# Patient Record
Sex: Male | Born: 1950
Health system: Southern US, Community
[De-identification: ages and names within clinical notes are randomized; demographics above are authoritative.]

## PROBLEM LIST (undated history)

## (undated) DIAGNOSIS — I10 Essential (primary) hypertension: Secondary | ICD-10-CM

## (undated) DIAGNOSIS — E119 Type 2 diabetes mellitus without complications: Secondary | ICD-10-CM

## (undated) DIAGNOSIS — E785 Hyperlipidemia, unspecified: Secondary | ICD-10-CM

## (undated) DIAGNOSIS — I25729 Atherosclerosis of autologous artery coronary artery bypass graft(s) with unspecified angina pectoris: Secondary | ICD-10-CM

## (undated) HISTORY — DX: Atherosclerosis of autologous artery coronary artery bypass graft(s) with unspecified angina pectoris: I25.729

## (undated) HISTORY — DX: Type 2 diabetes mellitus without complications: E11.9

## (undated) HISTORY — PX: CORONARY ARTERY BYPASS GRAFT: SHX141

---

## 2010-01-19 ENCOUNTER — Emergency Department (HOSPITAL_COMMUNITY): Admission: EM | Admit: 2010-01-19 | Discharge: 2010-01-19 | Payer: Self-pay | Admitting: Emergency Medicine

## 2010-01-19 ENCOUNTER — Emergency Department (HOSPITAL_COMMUNITY): Admission: EM | Admit: 2010-01-19 | Discharge: 2010-01-19 | Payer: Self-pay | Admitting: Family Medicine

## 2010-08-23 ENCOUNTER — Emergency Department (HOSPITAL_COMMUNITY): Payer: Self-pay

## 2010-08-23 ENCOUNTER — Inpatient Hospital Stay (HOSPITAL_COMMUNITY)
Admission: EM | Admit: 2010-08-23 | Discharge: 2010-08-24 | DRG: 866 | Disposition: A | Discharge status: Home / Self Care | Payer: Self-pay | Attending: Internal Medicine | Admitting: Internal Medicine

## 2010-08-23 DIAGNOSIS — F17200 Nicotine dependence, unspecified, uncomplicated: Secondary | ICD-10-CM | POA: Diagnosis present

## 2010-08-23 DIAGNOSIS — B9789 Other viral agents as the cause of diseases classified elsewhere: Principal | ICD-10-CM | POA: Diagnosis present

## 2010-08-23 LAB — CSF CELL COUNT WITH DIFFERENTIAL
Tube #: 1
Tube #: 4

## 2010-08-23 LAB — URINALYSIS, ROUTINE W REFLEX MICROSCOPIC
Ketones, ur: 15 mg/dL — AB
Leukocytes, UA: NEGATIVE
Nitrite: NEGATIVE
Protein, ur: 100 mg/dL — AB
pH: 5.5 (ref 5.0–8.0)

## 2010-08-23 LAB — CBC
HCT: 45.1 % (ref 39.0–52.0)
Hemoglobin: 16.2 g/dL (ref 13.0–17.0)
MCH: 31.8 pg (ref 26.0–34.0)
MCHC: 35.9 g/dL (ref 30.0–36.0)
MCV: 88.4 fL (ref 78.0–100.0)
RDW: 13.4 % (ref 11.5–15.5)

## 2010-08-23 LAB — GRAM STAIN

## 2010-08-23 LAB — COMPREHENSIVE METABOLIC PANEL
ALT: 40 U/L (ref 0–53)
BUN: 15 mg/dL (ref 6–23)
CO2: 24 mEq/L (ref 19–32)
Calcium: 8.8 mg/dL (ref 8.4–10.5)
GFR calc non Af Amer: >60 mL/min (ref >60)
Glucose, Bld: 126 mg/dL — ABNORMAL HIGH (ref 70–99)
Sodium: 135 mEq/L (ref 135–145)
Total Protein: 7.8 g/dL (ref 6.0–8.3)

## 2010-08-23 LAB — DIFFERENTIAL
Basophils Absolute: 0 10*3/uL (ref 0.0–0.1)
Eosinophils Relative: 0 % (ref 0–5)
Lymphocytes Relative: 8 % — ABNORMAL LOW (ref 12–46)
Lymphs Abs: 1.1 10*3/uL (ref 0.7–4.0)
Monocytes Absolute: 1.4 10*3/uL — ABNORMAL HIGH (ref 0.1–1.0)
Monocytes Relative: 10 % (ref 3–12)
Neutro Abs: 11.8 10*3/uL — ABNORMAL HIGH (ref 1.7–7.7)

## 2010-08-23 LAB — GLUCOSE, CSF: Glucose, CSF: 79 mg/dL — ABNORMAL HIGH (ref 43–76)

## 2010-08-23 LAB — URINE MICROSCOPIC-ADD ON

## 2010-08-23 LAB — PROTEIN, CSF: Total  Protein, CSF: 25 mg/dL (ref 15–45)

## 2010-08-24 ENCOUNTER — Observation Stay (HOSPITAL_COMMUNITY): Payer: Self-pay

## 2010-08-24 LAB — COMPREHENSIVE METABOLIC PANEL
AST: 22 U/L (ref 0–37)
Albumin: 3 g/dL — ABNORMAL LOW (ref 3.5–5.2)
Calcium: 8.4 mg/dL (ref 8.4–10.5)
Creatinine, Ser: 0.77 mg/dL (ref 0.4–1.5)
GFR calc Af Amer: >60 mL/min (ref >60)
GFR calc non Af Amer: >60 mL/min (ref >60)

## 2010-08-24 LAB — URINE CULTURE
Colony Count: NO GROWTH
Culture  Setup Time: 201205171655

## 2010-08-24 LAB — CARDIAC PANEL(CRET KIN+CKTOT+MB+TROPI)
CK, MB: 1.6 ng/mL (ref 0.3–4.0)
Relative Index: INVALID (ref 0.0–2.5)
Troponin I: <0.3 ng/mL (ref <0.30)

## 2010-08-24 LAB — LIPASE, BLOOD: Lipase: 12 U/L (ref 11–59)

## 2010-08-24 LAB — ANA: Anti Nuclear Antibody(ANA): NEGATIVE

## 2010-08-24 LAB — LIPID PANEL
Total CHOL/HDL Ratio: 6 RATIO
VLDL: 25 mg/dL (ref 0–40)

## 2010-08-24 LAB — LACTIC ACID, PLASMA: Lactic Acid, Venous: 1.1 mmol/L (ref 0.5–2.2)

## 2010-08-24 LAB — RAPID URINE DRUG SCREEN, HOSP PERFORMED
Barbiturates: NOT DETECTED
Cocaine: NOT DETECTED
Opiates: NOT DETECTED
Tetrahydrocannabinol: NOT DETECTED

## 2010-08-24 LAB — CBC
Hemoglobin: 14.3 g/dL (ref 13.0–17.0)
RBC: 4.58 MIL/uL (ref 4.22–5.81)

## 2010-08-24 LAB — CK TOTAL AND CKMB (NOT AT ARMC)
Relative Index: INVALID (ref 0.0–2.5)
Total CK: 76 U/L (ref 7–232)

## 2010-08-24 LAB — SAVE SMEAR

## 2010-08-24 MED ORDER — IOHEXOL 300 MG/ML  SOLN: 100.0000 mL | Freq: Once | INTRAMUSCULAR | Status: DC | PRN

## 2010-08-27 LAB — CSF CULTURE W GRAM STAIN: Culture: NO GROWTH

## 2010-08-29 LAB — CULTURE, BLOOD (ROUTINE X 2): Culture  Setup Time: 201205172324

## 2010-08-30 NOTE — H&P (Signed)
NAMECELIA, GIBBONS                      ACCOUNT NO.:  [id number]  MEDICAL RECORD NO.:  [id number]           PATIENT TYPE:  E  LOCATION:  [facility]                         FACILITY:  [facility]  PHYSICIAN:  Eduard Clos, MDDATE OF BIRTH:  Jun 25, 1950  DATE OF ADMISSION:  08/23/2010 DATE OF DISCHARGE:                             HISTORY & PHYSICAL   PATIENT'S PRIMARY CARE PHYSICIAN:  Unassigned.  The patient does not have a primary care physician.  CHIEF COMPLAINT:  Fever and generalized body ache.  HISTORY OF PRESENTING ILLNESS:  A 60 year old male with no significant past medical history, has been experiencing some fever, chills over the last 3 days which worsened today and he came to the ER.  The patient in addition is having generalized body ache and weakness.  The patient does not have any chest pain or any cough or phlegm.  Denies any shortness of breath.  Denies any headache or any visual symptoms.  Denies any focal deficit, though he feels weak.  The only complain he has is the left thigh pain which was present from today.  The patient denies any nausea, vomiting, abdominal pain, dysuria, discharge or diarrhea.  In the ER, the patient had a CT head, chest x-ray, urinalysis all at this time were negative for anything acute, so the patient underwent a lumbar puncture at this time.  The lumbar puncture opening pressure was 10 as I discussed with radiologist and the WBC count was just one with RBC of 310.  Gram stain is negative.  At this time, the patient has been admitted for further workup for fever of unknown origin.  PAST MEDICAL HISTORY:  Nothing significant.  PAST SURGICAL HISTORY:  None.  SOCIAL HISTORY:  The patient smokes cigarettes, does not drink alcohol or use any drugs.  Married, lives with his wife, has been in [place] for the last 7 years.  He is from [place].  FAMILY HISTORY:  Mom had lung cancer.  ALLERGIES:  No known drug allergies.  MEDICATIONS PRIOR TO  ADMISSION:  None.  REVIEW OF SYSTEMS:  As per history of presenting illness, in addition, the patient denies having traveled anywhere recently, has not gone to any forest or any cruise.  She has not observed any insect bites, has not had any trauma.  Denies any weight loss.  PHYSICAL EXAMINATION:  GENERAL:  The patient examined at bedside, not in acute distress. VITAL SIGNS:  Blood pressure is 117/68, pulse is 80 per minute, temperature 103.8 rectally, respirations 18 and O2 sat 97%.  HEENT: Anicteric.  No pallor.  No facial asymmetry.  Tongue is midline.  I do not see any pharyngeal exudates or any erythema in the pharynx.  PLA positive. NECK:  No neck rigidity and no tenderness in the neck and no discharge from ears, eyes, nose and mouth. CHEST:  Bilateral air entry present.  No rhonchi and no crepitation. HEART:  S1 and S2 heard. ABDOMEN:  Soft and  nontender.  Bowel sounds heard. CNS:  The patient is alert, awake, oriented to time, place and person and moves upper  and lower extremities 5/5. EXTREMITIES:  Peripheral pulses felt.  There is chronic skin changes in his lower extremity.  I do not see any rash.  There is no any acute cyanotic changes, clubbing.  Pulses felt.  LABORATORY DATA:  CBC; WBC is 14.3, hemoglobin is 16.2, hematocrit is 45.1 and platelets 194.  Neutrophils 83%, monocytes 10%, eosinophils 0. Complete metabolic panel; sodium 135, potassium 3.7, chloride 101, carbon dioxide 24, glucose 126, BUN 15, creatinine 0.97, total bilirubin is 0.6, AST 25, ALT 40, total protein is 7.8, albumin 3.7, calcium 8.8. UA shows negative for nitrites, leukocytes, WBC is 0, bacteria rare. Mucus present, glucose 100, ketones 15.  The patient's anion gap is 10. Lumbar puncture tube #1 shows colorless fluid, WBC 1, RBC 310, CSF glucose is 79.  CSF protein is 25.  Gram stain.  CSF no organisms seen. Lumbar puncture pressure was 10 as discussed with radiologist.  Rest of the tests are  pending.  ASSESSMENT:  Fever of unknown origin.  PLAN: 1. At this time, we will admit the patient to telemetry. 2. We do not know exact source for his fever.  He is also complaining     of generalized body ache with particularly left thigh pain for     which I am going to get a left thigh CT scan.  I also got a Doppler     of the lower extremity.  At this time, the patient was started on     some antibiotics for suspected meningitis.  At this time, CSF fluid     is showing a WBC of one with opening pressure just 10 is unlikely     to be bacterial meningitis.  Gram stain is also negative.  We are     going to get blood cultures and urine cultures.  We will repeat     chest x-ray in a.m. We will get peripheral smear and sed rate, ANA     C-reactive protein.  Repeat labs in a.m.  We will also check     lipase.  At this time, the patient has no headache or any meningeal     signs.  At this time, I am going to hold off any antibiotics.  We     will get an EKG and also get a cardiac enzymes.  If EKG shows any     features of pericarditis, then we will get a 2-D echo.  During the     cardiac enzymes in particularly, we will look for any     rhabdomyolysis.  At this time, we are not going to start any     antibiotics.  We will closely observe the patient.  Further     recommendation as condition evolves and clinic course.     Eduard Clos, MD     ANK/MEDQ  D:  08/23/2010  T:  08/24/2010  Job:  [id number]  Electronically Signed by Midge Minium MD on 08/30/2010 06:18:42 AM

## 2010-08-30 NOTE — H&P (Signed)
  [name], Peter Wong                      ACCOUNT NO.:  [id number]  MEDICAL RECORD NO.:  [id number]           PATIENT TYPE:  E  LOCATION:  [facility]                         FACILITY:  [facility]  PHYSICIAN:  Eduard Clos, MDDATE OF BIRTH:  1951/03/15  DATE OF ADMISSION:  08/23/2010 DATE OF DISCHARGE:                             HISTORY & PHYSICAL   ADDENDUM  The patient did have a CT head without contrast which showed no acute or focal intracranial findings.  This showed chronic sinusitis.  The patient's chest x-ray showed low lung volumes with vascular crowding and atelectasis, vascular congestion without overt pulmonary edema.  I am also at this time going to get procalcitonin level and lactic acid level.  We will have x-ray of the paranasal sinuses, and at this time the patient does not complain of any running nose or any tenderness around the sinuses or any sore throat or any difficulty swallowing.     Eduard Clos, MD  ANK/MEDQ  D:  08/24/2010  T:  08/24/2010  Job:  [id number]  Electronically Signed by Midge Minium MD on 08/30/2010 06:18:34 AM

## 2010-09-01 NOTE — Discharge Summary (Signed)
  NAMEZARIAN, COLPITTS                      ACCOUNT NO.:  [id number]  MEDICAL RECORD NO.:  [id number]           PATIENT TYPE:  O  LOCATION:  5509                         FACILITY:  [facility]  PHYSICIAN:  Lonia Blood, M.D.       DATE OF BIRTH:  1950-04-18  DATE OF ADMISSION:  08/23/2010 DATE OF DISCHARGE:  08/24/2010                              DISCHARGE SUMMARY   PRIMARY CARE PHYSICIAN:  This patient has been referred to [facility].  DISCHARGE DIAGNOSES: 1. Febrile.  No etiology, self-limiting, probably viral in nature. 2. Mild leukocytosis of about 14,000.  CONDITION ON DISCHARGE:  The patient was discharged in stable condition. Temperature 97.8, pulse 78, respirations 19, blood pressure 112/68, saturation 94% on room air.  The patient is alert, oriented, demanding to leave saying that he feels perfectly fine and should be let him go home.  PROCEDURE DURING THIS ADMISSION: 1. The patient underwent multiple testing including lumbar puncture     which essentially showed a glucose 79, protein of 25, cell count     with 3 white blood cells, 1 white blood cell, 3-10 red blood cells     suggestive of traumatic puncture. 2. MRI of the brain which was essentially within normal limits with     atrophy. 3. CT scan of the femur with IV contrast which was essentially normal     without any signs of infection. 4. Chest x-ray PA and lateral which showed low lung volumes and no     infiltrates. 5. Blood cultures x2 and urine culture which were negative for growth.  HISTORY AND PHYSICAL:  Refer to dictation done by Dr. Toniann Fail.  HOSPITAL COURSE:  Mr. Nephew presented to the emergency room complaining of fever, feeling sick.  He underwent testing for everything possible including bacteremia, urinary tract infection, cellulitis, myositis, meningitis without any clear infectious cause being found.  Furthermore by hospital day #2, the fever resolved and the patient requested to be sent  home.  We proceeded with discharging him home instructing him to call for followup with [facility].     Lonia Blood, M.D.     SL/MEDQ  D:  08/24/2010  T:  08/25/2010  Job:  [id number]  Electronically Signed by Lonia Blood M.D. on 09/01/2010 08:18:04 AM

## 2013-09-21 ENCOUNTER — Encounter: Payer: Self-pay | Admitting: Podiatry

## 2013-09-21 ENCOUNTER — Ambulatory Visit (INDEPENDENT_AMBULATORY_CARE_PROVIDER_SITE_OTHER): Payer: BC Managed Care – PPO | Admitting: Podiatry

## 2013-09-21 VITALS — BP 137/80 | HR 78 | Ht 66.93 in | Wt 183.0 lb

## 2013-09-21 DIAGNOSIS — M79606 Pain in leg, unspecified: Secondary | ICD-10-CM | POA: Insufficient documentation

## 2013-09-21 DIAGNOSIS — M216X9 Other acquired deformities of unspecified foot: Secondary | ICD-10-CM | POA: Insufficient documentation

## 2013-09-21 DIAGNOSIS — M79609 Pain in unspecified limb: Secondary | ICD-10-CM

## 2013-09-21 DIAGNOSIS — M722 Plantar fascial fibromatosis: Secondary | ICD-10-CM

## 2013-09-21 NOTE — Progress Notes (Signed)
Subjective: 63 year old male presents complaining of Left heel pain x 2 months at plantar medial aspect. On feet all day in retail store. This is first episode. Feet sweats a lot and must keep slippers on.  Objective: Dermatologic: Dry peeling skin plantar bilateral. Neurologic: All epicritic and tactile sensations grossly intact.  Vascular: All pedal pulses are palpable.  Orthopedic: Tight Achilles tendon left, mild hypermobility of left first ray. Pain upon pressure at plantar medial heel.   Assessment: Plantar fasciitis left. Ankle Equinus left. Forefoot varus left.   Plan: Reviewed clinical findings and available options. Left heel injected with mixture of 4 mg Dexamethasone, 4 mg Triamcinolone, and 1 cc of 0.5% Marcaine plain. Patient tolerated well without difficulty.  Return if pain continues.

## 2013-09-21 NOTE — Patient Instructions (Signed)
Seen for painful heel left foot.  Cortisone injection given.  Need to replace worn out shoes.  Return if pain continues.

## 2013-12-01 ENCOUNTER — Encounter: Payer: Self-pay | Admitting: Podiatry

## 2013-12-01 ENCOUNTER — Ambulatory Visit: Payer: BC Managed Care – PPO | Admitting: Podiatry

## 2013-12-01 ENCOUNTER — Ambulatory Visit (INDEPENDENT_AMBULATORY_CARE_PROVIDER_SITE_OTHER): Payer: BC Managed Care – PPO | Admitting: Podiatry

## 2013-12-01 VITALS — BP 139/77 | HR 71 | Ht 66.93 in | Wt 183.0 lb

## 2013-12-01 DIAGNOSIS — M79609 Pain in unspecified limb: Secondary | ICD-10-CM

## 2013-12-01 DIAGNOSIS — M79605 Pain in left leg: Secondary | ICD-10-CM

## 2013-12-01 DIAGNOSIS — M722 Plantar fascial fibromatosis: Secondary | ICD-10-CM

## 2013-12-01 NOTE — Progress Notes (Signed)
Patient presents complaining of pain in left heel and requests for a cortisone injection. He has gotten new pair of tennis shoes but cannot wear them due to excess perspiration on both feet.  Assessment: Plantar fasciitis left.  Plan: Left heel Injected with mixture of 4 mg Dexamethasone, 4 mg Triamcinolone, and 1 cc of 0.5% Marcaine plain. Patient tolerated well without difficulty.

## 2014-10-07 HISTORY — PX: CORONARY ARTERY BYPASS GRAFT: SHX141

## 2014-11-02 DIAGNOSIS — R079 Chest pain, unspecified: Secondary | ICD-10-CM | POA: Insufficient documentation

## 2014-11-03 DIAGNOSIS — I200 Unstable angina: Secondary | ICD-10-CM | POA: Insufficient documentation

## 2014-11-03 DIAGNOSIS — R9439 Abnormal result of other cardiovascular function study: Secondary | ICD-10-CM | POA: Insufficient documentation

## 2014-11-07 DIAGNOSIS — I2510 Atherosclerotic heart disease of native coronary artery without angina pectoris: Secondary | ICD-10-CM | POA: Insufficient documentation

## 2014-11-23 DIAGNOSIS — Z951 Presence of aortocoronary bypass graft: Secondary | ICD-10-CM | POA: Insufficient documentation

## 2014-11-24 DIAGNOSIS — E785 Hyperlipidemia, unspecified: Secondary | ICD-10-CM | POA: Insufficient documentation

## 2014-11-24 DIAGNOSIS — I252 Old myocardial infarction: Secondary | ICD-10-CM | POA: Insufficient documentation

## 2017-05-09 DIAGNOSIS — Z1211 Encounter for screening for malignant neoplasm of colon: Secondary | ICD-10-CM | POA: Insufficient documentation

## 2017-06-04 HISTORY — PX: COLONOSCOPY: SHX174

## 2017-11-04 DIAGNOSIS — G8929 Other chronic pain: Secondary | ICD-10-CM | POA: Insufficient documentation

## 2017-11-04 DIAGNOSIS — M1711 Unilateral primary osteoarthritis, right knee: Secondary | ICD-10-CM | POA: Insufficient documentation

## 2017-11-04 DIAGNOSIS — M25512 Pain in left shoulder: Secondary | ICD-10-CM | POA: Insufficient documentation

## 2018-06-04 ENCOUNTER — Emergency Department (HOSPITAL_BASED_OUTPATIENT_CLINIC_OR_DEPARTMENT_OTHER): Payer: BLUE CROSS/BLUE SHIELD

## 2018-06-04 ENCOUNTER — Emergency Department (HOSPITAL_BASED_OUTPATIENT_CLINIC_OR_DEPARTMENT_OTHER)
Admission: EM | Admit: 2018-06-04 | Discharge: 2018-06-04 | Disposition: A | Discharge status: Home / Self Care | Payer: BLUE CROSS/BLUE SHIELD | Attending: Emergency Medicine | Admitting: Emergency Medicine

## 2018-06-04 ENCOUNTER — Other Ambulatory Visit: Payer: Self-pay

## 2018-06-04 ENCOUNTER — Encounter (HOSPITAL_BASED_OUTPATIENT_CLINIC_OR_DEPARTMENT_OTHER): Payer: Self-pay | Admitting: Emergency Medicine

## 2018-06-04 DIAGNOSIS — I2510 Atherosclerotic heart disease of native coronary artery without angina pectoris: Secondary | ICD-10-CM | POA: Diagnosis not present

## 2018-06-04 DIAGNOSIS — I10 Essential (primary) hypertension: Secondary | ICD-10-CM | POA: Insufficient documentation

## 2018-06-04 DIAGNOSIS — F17210 Nicotine dependence, cigarettes, uncomplicated: Secondary | ICD-10-CM | POA: Diagnosis not present

## 2018-06-04 DIAGNOSIS — Z951 Presence of aortocoronary bypass graft: Secondary | ICD-10-CM | POA: Insufficient documentation

## 2018-06-04 DIAGNOSIS — R220 Localized swelling, mass and lump, head: Secondary | ICD-10-CM

## 2018-06-04 DIAGNOSIS — R002 Palpitations: Secondary | ICD-10-CM

## 2018-06-04 DIAGNOSIS — Z79899 Other long term (current) drug therapy: Secondary | ICD-10-CM | POA: Diagnosis not present

## 2018-06-04 DIAGNOSIS — R600 Localized edema: Secondary | ICD-10-CM | POA: Diagnosis present

## 2018-06-04 HISTORY — DX: Essential (primary) hypertension: I10

## 2018-06-04 HISTORY — DX: Hyperlipidemia, unspecified: E78.5

## 2018-06-04 LAB — CBC WITH DIFFERENTIAL/PLATELET
ABS IMMATURE GRANULOCYTES: 0.07 10*3/uL (ref 0.00–0.07)
Basophils Absolute: 0 10*3/uL (ref 0.0–0.1)
Basophils Relative: 0 %
EOS PCT: 2 %
Eosinophils Absolute: 0.2 10*3/uL (ref 0.0–0.5)
HEMATOCRIT: 42.2 % (ref 39.0–52.0)
Hemoglobin: 14.2 g/dL (ref 13.0–17.0)
IMMATURE GRANULOCYTES: 1 %
LYMPHS ABS: 0.8 10*3/uL (ref 0.7–4.0)
LYMPHS PCT: 6 %
MCH: 30.9 pg (ref 26.0–34.0)
MCHC: 33.6 g/dL (ref 30.0–36.0)
MCV: 91.9 fL (ref 80.0–100.0)
MONO ABS: 0.6 10*3/uL (ref 0.1–1.0)
MONOS PCT: 5 %
NRBC: 0 % (ref 0.0–0.2)
Neutro Abs: 10.7 10*3/uL — ABNORMAL HIGH (ref 1.7–7.7)
Neutrophils Relative %: 86 %
Platelets: 145 10*3/uL — ABNORMAL LOW (ref 150–400)
RBC: 4.59 MIL/uL (ref 4.22–5.81)
RDW: 12.8 % (ref 11.5–15.5)
WBC: 12.4 10*3/uL — ABNORMAL HIGH (ref 4.0–10.5)

## 2018-06-04 LAB — COMPREHENSIVE METABOLIC PANEL
ALT: 32 U/L (ref 0–44)
AST: 25 U/L (ref 15–41)
Albumin: 3.6 g/dL (ref 3.5–5.0)
Alkaline Phosphatase: 72 U/L (ref 38–126)
Anion gap: 10 (ref 5–15)
BILIRUBIN TOTAL: 1.4 mg/dL — AB (ref 0.3–1.2)
BUN: 15 mg/dL (ref 8–23)
CALCIUM: 7.7 mg/dL — AB (ref 8.9–10.3)
CHLORIDE: 99 mmol/L (ref 98–111)
CO2: 20 mmol/L — ABNORMAL LOW (ref 22–32)
CREATININE: 1 mg/dL (ref 0.61–1.24)
Glucose, Bld: 313 mg/dL — ABNORMAL HIGH (ref 70–99)
POTASSIUM: 3.2 mmol/L — AB (ref 3.5–5.1)
Sodium: 129 mmol/L — ABNORMAL LOW (ref 135–145)
Total Protein: 6.3 g/dL — ABNORMAL LOW (ref 6.5–8.1)

## 2018-06-04 LAB — URINALYSIS, MICROSCOPIC (REFLEX)

## 2018-06-04 LAB — PROTIME-INR
INR: 1.2 (ref 0.8–1.2)
Prothrombin Time: 15.2 seconds (ref 11.4–15.2)

## 2018-06-04 LAB — TROPONIN I: Troponin I: <0.03 ng/mL (ref <0.03)

## 2018-06-04 LAB — URINALYSIS, ROUTINE W REFLEX MICROSCOPIC
Bilirubin Urine: NEGATIVE
Glucose, UA: >=500 mg/dL — AB
KETONES UR: NEGATIVE mg/dL
Leukocytes,Ua: NEGATIVE
Nitrite: NEGATIVE
Protein, ur: NEGATIVE mg/dL
Specific Gravity, Urine: <1.005 — ABNORMAL LOW (ref 1.005–1.030)
pH: 5.5 (ref 5.0–8.0)

## 2018-06-04 LAB — LACTIC ACID, PLASMA: Lactic Acid, Venous: 1.9 mmol/L (ref 0.5–1.9)

## 2018-06-04 MED ORDER — CLINDAMYCIN HCL 150 MG PO CAPS: 300.0000 mg | ORAL_CAPSULE | Freq: Three times a day (TID) | ORAL | 0 refills | Status: AC

## 2018-06-04 MED ORDER — SODIUM CHLORIDE 0.9% FLUSH
3.0000 mL | Freq: Once | INTRAVENOUS | Status: DC
  Filled 2018-06-04: qty 3

## 2018-06-04 MED ORDER — AMOXICILLIN-POT CLAVULANATE 875-125 MG PO TABS: 1.0000 | ORAL_TABLET | Freq: Two times a day (BID) | ORAL | 0 refills | Status: DC

## 2018-06-04 MED ORDER — SODIUM CHLORIDE 0.9 % IV BOLUS
1000.0000 mL | Freq: Once | INTRAVENOUS | Status: AC
  Administered 2018-06-04: 1000 mL via INTRAVENOUS

## 2018-06-04 MED ORDER — IOPAMIDOL (ISOVUE-300) INJECTION 61%: 100.0000 mL | Freq: Once | INTRAVENOUS | Status: DC | PRN

## 2018-06-04 MED ORDER — ACETAMINOPHEN 325 MG PO TABS
650.0000 mg | ORAL_TABLET | Freq: Once | ORAL | Status: AC
  Administered 2018-06-04: 650 mg via ORAL
  Filled 2018-06-04: qty 2

## 2018-06-04 MED ORDER — IOHEXOL 300 MG/ML  SOLN
100.0000 mL | Freq: Once | INTRAMUSCULAR | Status: AC | PRN
  Administered 2018-06-04: 75 mL via INTRAVENOUS

## 2018-06-04 MED ORDER — SODIUM CHLORIDE 0.9 % IV SOLN
2.0000 g | Freq: Once | INTRAVENOUS | Status: AC
  Administered 2018-06-04: 2 g via INTRAVENOUS
  Filled 2018-06-04: qty 20

## 2018-06-04 NOTE — ED Notes (Signed)
Pt escorted to d/c window 

## 2018-06-04 NOTE — ED Provider Notes (Signed)
[facility] EMERGENCY DEPARTMENT Provider Note   CSN: [id number] Arrival date & time: 06/04/18  [id number]    History   Chief Complaint Chief Complaint  Patient presents with  . Palpitations  . Facial Swelling    HPI Peter RoeSu Wong is a 68 y.o. male with a PMH CAD, HTN, and s/p CABG in 2016 presenting with palpitations and left facial edema onset yesterday morning. Patient speaks primarily [place]. Translator used during history and physical exam. Wife and friend are contributing historians. Patient denies pain. Patient reports facial edema has worsened since onset. Patient states he has tried advil with partial relief. Patient denies taking Advil today. Patient reports constant palpitations onset 2 days ago. Patient reports fever, but denies nausea, vomiting, or abdominal pain. Patient denies congestion, cough, sore throat, or headache. Patient denies chest pain or shortness of breath. Patient states he has not traveled out of the country recently and denies sick exposures. Patient denies wounds. Patient denies pain with eye movement or visual disturbance. Patient denies a history of diabetes.     HPI  Past Medical History:  Diagnosis Date  . Hyperlipidemia   . Hypertension     Patient Active Problem List   Diagnosis Date Noted  . Plantar fasciitis of left foot 09/21/2013  . Equinus deformity of foot, acquired 09/21/2013  . Pain in lower limb 09/21/2013    Past Surgical History:  Procedure Laterality Date  . CORONARY ARTERY BYPASS GRAFT          Home Medications    Prior to Admission medications   Medication Sig Start Date End Date Taking? Authorizing Provider  METOPROLOL SUCCINATE PO Take by mouth.   Yes [provider]  amoxicillin-clavulanate (AUGMENTIN) 875-125 MG tablet Take 1 tablet by mouth every 12 (twelve) hours. 06/04/18   Carlyle BasquesHernandez, Chi Woodham P, PA-C  clindamycin (CLEOCIN) 150 MG capsule Take 2 capsules (300 mg total) by mouth every 8 (eight) hours for 7  days. 06/04/18 06/11/18  Leretha DykesHernandez, Ravon Mortellaro P, PA-C    Family History History reviewed. No pertinent family history.  Social History Social History   Tobacco Use  . Smoking status: Current Every Day Smoker    Types: Cigarettes  . Smokeless tobacco: Never Used  Substance Use Topics  . Alcohol use: Not on file  . Drug use: Not on file     Allergies   Patient has no known allergies.   Review of Systems Review of Systems  Constitutional: Negative for chills, diaphoresis and fever.  HENT: Positive for facial swelling. Negative for congestion, dental problem, ear discharge, ear pain, rhinorrhea, sinus pressure, sinus pain, sore throat, trouble swallowing and voice change.   Eyes: Negative for photophobia, pain, discharge, redness, itching and visual disturbance.  Respiratory: Negative for cough and shortness of breath.   Cardiovascular: Positive for palpitations. Negative for chest pain and leg swelling.  Gastrointestinal: Negative for abdominal pain, nausea and vomiting.  Endocrine: Negative for cold intolerance and heat intolerance.  Genitourinary: Negative for dysuria.  Musculoskeletal: Negative for back pain, neck pain and neck stiffness.  Skin: Negative for rash.  Neurological: Negative for dizziness, syncope, speech difficulty, weakness, numbness and headaches.  Hematological: Negative for adenopathy.     Physical Exam Updated Vital Signs BP 113/67   Pulse 88   Temp 100.3 F (37.9 C)   Resp (!) 24   Ht 5' 6.93" (1.7 m)   Wt 80.7 kg   SpO2 98%   BMI 27.94 kg/m   Physical Exam Vitals signs  and nursing note reviewed.  Constitutional:      General: He is not in acute distress.    Appearance: He is well-developed. He is not diaphoretic.  HENT:     Head: Normocephalic and atraumatic.      Right Ear: Tympanic membrane, ear canal and external ear normal.     Left Ear: Tympanic membrane, ear canal and external ear normal.     Nose: Congestion present. No rhinorrhea.      Mouth/Throat:     Mouth: Mucous membranes are moist.     Pharynx: No oropharyngeal exudate or posterior oropharyngeal erythema.  Eyes:     General:        Right eye: No discharge.        Left eye: No discharge.     Extraocular Movements: Extraocular movements intact.     Conjunctiva/sclera: Conjunctivae normal.     Pupils: Pupils are equal, round, and reactive to light.  Neck:     Musculoskeletal: Normal range of motion and neck supple. No muscular tenderness.  Cardiovascular:     Rate and Rhythm: Normal rate and regular rhythm.     Heart sounds: Normal heart sounds. No murmur. No friction rub. No gallop.   Pulmonary:     Effort: Pulmonary effort is normal. No respiratory distress.     Breath sounds: Normal breath sounds. No wheezing or rales.  Abdominal:     General: There is no distension.     Palpations: Abdomen is soft.     Tenderness: There is no abdominal tenderness.  Musculoskeletal: Normal range of motion.     Right lower leg: No edema.     Left lower leg: No edema.  Skin:    General: Skin is warm.     Findings: No erythema or rash.  Neurological:     Mental Status: He is alert and oriented to person, place, and time.      ED Treatments / Results  Labs (all labs ordered are listed, but only abnormal results are displayed) Labs Reviewed  COMPREHENSIVE METABOLIC PANEL - Abnormal; Notable for the following components:      Result Value   Sodium 129 (*)    Potassium 3.2 (*)    CO2 20 (*)    Glucose, Bld 313 (*)    Calcium 7.7 (*)    Total Protein 6.3 (*)    Total Bilirubin 1.4 (*)    All other components within normal limits  CBC WITH DIFFERENTIAL/PLATELET - Abnormal; Notable for the following components:   WBC 12.4 (*)    Platelets 145 (*)    Neutro Abs 10.7 (*)    All other components within normal limits  URINALYSIS, ROUTINE W REFLEX MICROSCOPIC - Abnormal; Notable for the following components:   Specific Gravity, Urine <1.005 (*)    Glucose, UA >=500  (*)    Hgb urine dipstick TRACE (*)    All other components within normal limits  URINALYSIS, MICROSCOPIC (REFLEX) - Abnormal; Notable for the following components:   Bacteria, UA MANY (*)    All other components within normal limits  CULTURE, BLOOD (ROUTINE X 2)  CULTURE, BLOOD (ROUTINE X 2)  LACTIC ACID, PLASMA  PROTIME-INR  TROPONIN I    EKG EKG Interpretation  Date/Time:  Thursday June 04 2018 10:06:40 EST Ventricular Rate:  98 PR Interval:    QRS Duration: 91 QT Interval:  333 QTC Calculation: 426 R Axis:   -12 Text Interpretation:  Sinus rhythm Borderline prolonged PR interval Abnormal  R-wave progression, early transition Nonspecific T abnormalities, anterior leads Confirmed by Jacalyn Lefevre [phone]) on 06/04/2018 10:13:36 AM   Radiology Dg Chest 2 View  Result Date: 06/04/2018 CLINICAL DATA:  Two day history of heart palpitations. EXAM: CHEST - 2 VIEW COMPARISON:  11/10/2014 FINDINGS: Previous median sternotomy. Mild cardiomegaly. Chronic appearing lung markings consistent with scarring. Pulmonary vascularity is normal without edema or effusions. No focal mass, consolidation or collapse. No significant bone finding. IMPRESSION: Previous CABG. Mild cardiomegaly. Likely chronic lung markings. No sign of acute heart failure. Electronically Signed   By: Paulina Fusi M.D.   On: 06/04/2018 10:57   Ct Soft Tissue Neck W Contrast  Result Date: 06/04/2018 CLINICAL DATA:  Left-sided face and neck swelling. EXAM: CT NECK WITH CONTRAST TECHNIQUE: Multidetector CT imaging of the neck was performed using the standard protocol following the bolus administration of intravenous contrast. CONTRAST:  34mL OMNIPAQUE IOHEXOL 300 MG/ML  SOLN COMPARISON:  CT and MRI May of 2012. FINDINGS: The study suffers from motion degradation. Pharynx and larynx: No mucosal or submucosal lesion is seen allowing for the motion. Salivary glands: Right parotid and submandibular glands are normal. Question mild  swelling and enhancement of the left parotid gland with mild edema in the soft tissue planes of the left neck which could go along with left parotid sialoadenitis. I do not see evidence of a mass or stone. No sign of an abscess. Left submandibular gland is normal. Thyroid: Normal Lymph nodes: No enlarged or low-density nodes on either side of the neck. Vascular: No arterial or vascular occlusion identified. Minimal atherosclerotic calcification at the carotid bifurcations. Limited intracranial: Normal Visualized orbits: Normal Mastoids and visualized paranasal sinuses: Scattered opacified ethmoid air cells. No advanced sinusitis. Mastoids are clear. Skeleton: Ordinary mid cervical spondylosis. Upper chest: Negative Other: None IMPRESSION: Subtle asymmetry of the left parotid gland compared to the right, suggesting mild parotid sialoadenitis. Some nonspecific soft tissue edema in the left side of the neck. No evidence of an abscess. No evidence of a mass or stone disease. Electronically Signed   By: Paulina Fusi M.D.   On: 06/04/2018 11:29    Procedures Procedures (including critical care time)  Medications Ordered in ED Medications  sodium chloride flush (NS) 0.9 % injection 3 mL (3 mLs Intravenous Not Given 06/04/18 1021)  iopamidol (ISOVUE-300) 61 % injection 100 mL (has no administration in time range)  sodium chloride 0.9 % bolus 1,000 mL (0 mLs Intravenous Stopped 06/04/18 1220)  acetaminophen (TYLENOL) tablet 650 mg (650 mg Oral Given 06/04/18 1035)  cefTRIAXone (ROCEPHIN) 2 g in sodium chloride 0.9 % 100 mL IVPB (0 g Intravenous Stopped 06/04/18 1145)  iohexol (OMNIPAQUE) 300 MG/ML solution 100 mL (75 mLs Intravenous Contrast Given 06/04/18 1100)     Initial Impression / Assessment and Plan / ED Course  I have reviewed the triage vital signs and the nursing notes.  Pertinent labs & imaging results that were available during my care of the patient were reviewed by me and considered in my medical  decision making (see chart for details).  Clinical Course as of Jun 04 1220  Thu Jun 04, 2018  1103 Previous CABG noted on CXR. Mild cardiomegaly. Likely chronic lung markings. No sign of acute heart failure.    DG Chest 2 View [AH]  1138 Subtle asymmetry of the left parotid gland compared to the right, suggesting mild parotid sialoadenitis. Some nonspecific soft tissue edema in the left side of the neck. No evidence of an  abscess. No evidence of a mass or stone disease.    CT Soft Tissue Neck W Contrast [AH]  1142 Leukocytosis noted at 12.4.   WBC(!): 12.4 [AH]    Clinical Course User Index [AH] Leretha Dykes, PA-C      Patient presents with left sided facial edema and palpitations. Suspect facial edema is likely due to preseptal cellulitis. Patient does not have pain. Provided IVF and antibiotics in the ER. Palpitations are likely due to fever. Tachycardia has improved while in the ER. Advised patient to take tylenol and ibuprofen for fever. Patient denies chest pain or shortness of breath. Patient is stable and will be discharged with antibiotics. Discussed strict return precautions with patient and wife. Patient and wife state they understand and agree with plan.   Findings and plan of care discussed with supervising physician Dr. Particia Nearing who personally evaluated and examined this patient.   Final Clinical Impressions(s) / ED Diagnoses   Final diagnoses:  Palpitations  Facial swelling    ED Discharge Orders         Ordered    amoxicillin-clavulanate (AUGMENTIN) 875-125 MG tablet  Every 12 hours     06/04/18 1217    clindamycin (CLEOCIN) 150 MG capsule  Every 8 hours     06/04/18 1217           Leretha Dykes, [place] 06/04/18 1222    Jacalyn Lefevre, MD 06/04/18 1235

## 2018-06-04 NOTE — Discharge Instructions (Signed)
You have been seen today for facial swelling and palpitations. Please read and follow all provided instructions.   1. Medications: Clindamycin and Augmentin (antibiotics), usual home medications 2. Treatment: rest, drink plenty of fluids 3. Follow Up: Please return to ER in 24-48 hours if symptoms worsen. Please follow up with your primary doctor in 2 days for discussion of your diagnoses and further evaluation after today's visit; if you do not have a primary care doctor use the resource guide provided to find one; Please return to the ER for any new or worsening symptoms. Please obtain all of your results from medical records or have your doctors office obtain the results - share them with your doctor - you should be seen at your doctors office. Call today to arrange your follow up.   Take medications as prescribed. Please review all of the medicines and only take them if you do not have an allergy to them. Return to the emergency room for worsening condition or new concerning symptoms. Follow up with your regular doctor. If you don't have a regular doctor use one of the numbers below to establish a primary care doctor.  Please be aware that if you are taking birth control pills, taking other prescriptions, ESPECIALLY ANTIBIOTICS may make the birth control ineffective - if this is the case, either do not engage in sexual activity or use alternative methods of birth control such as condoms until you have finished the medicine and your family doctor says it is OK to restart them. If you are on a blood thinner such as COUMADIN, be aware that any other medicine that you take may cause the coumadin to either work too much, or not enough - you should have your coumadin level rechecked in next 7 days if this is the case.  ?  It is also a possibility that you have an allergic reaction to any of the medicines that you have been prescribed - Everybody reacts differently to medications and while MOST people have no  trouble with most medicines, you may have a reaction such as nausea, vomiting, rash, swelling, shortness of breath. If this is the case, please stop taking the medicine immediately and contact your physician.  ?  You should return to the ER if you develop severe or worsening symptoms.   Emergency Department Resource Guide 1) Find a Doctor and Pay Out of Pocket Although you won't have to find out who is covered by your insurance plan, it is a good idea to ask around and get recommendations. You will then need to call the office and see if the doctor you have chosen will accept you as a new patient and what types of options they offer for patients who are self-pay. Some doctors offer discounts or will set up payment plans for their patients who do not have insurance, but you will need to ask so you aren't surprised when you get to your appointment.  2) Contact Your Local Health Department Not all health departments have doctors that can see patients for sick visits, but many do, so it is worth a call to see if yours does. If you don't know where your local health department is, you can check in your phone book. The CDC also has a tool to help you locate your state's health department, and many state websites also have listings of all of their local health departments.  3) Find a Walk-in Clinic If your illness is not likely to be very severe or  complicated, you may want to try a walk in clinic. These are popping up all over the country in pharmacies, drugstores, and shopping centers. They're usually staffed by nurse practitioners or physician assistants that have been trained to treat common illnesses and complaints. They're usually fairly quick and inexpensive. However, if you have serious medical issues or chronic medical problems, these are probably not your best option.  No Primary Care Doctor: Call Health Connect at  [phone] - they can help you locate a primary care doctor that  accepts your  insurance, provides certain services, etc. Physician Referral [phone]  Emergency Department Resource Guide 1) Find a Doctor and Pay Out of Pocket Although you won't have to find out who is covered by your insurance plan, it is a good idea to ask around and get recommendations. You will then need to call the office and see if the doctor you have chosen will accept you as a new patient and what types of options they offer for patients who are self-pay. Some doctors offer discounts or will set up payment plans for their patients who do not have insurance, but you will need to ask so you aren't surprised when you get to your appointment.  2) Contact Your Local Health Department Not all health departments have doctors that can see patients for sick visits, but many do, so it is worth a call to see if yours does. If you don't know where your local health department is, you can check in your phone book. The CDC also has a tool to help you locate your state's health department, and many state websites also have listings of all of their local health departments.  3) Find a Walk-in Clinic If your illness is not likely to be very severe or complicated, you may want to try a walk in clinic. These are popping up all over the country in pharmacies, drugstores, and shopping centers. They're usually staffed by nurse practitioners or physician assistants that have been trained to treat common illnesses and complaints. They're usually fairly quick and inexpensive. However, if you have serious medical issues or chronic medical problems, these are probably not your best option.  No Primary Care Doctor: Call Health Connect at  [phone] - they can help you locate a primary care doctor that  accepts your insurance, provides certain services, etc. Physician Referral Service- [phone]  Chronic Pain Problems: Organization         Address  Phone   Notes  [facility]  [phone]  Patients need to be referred by their primary care doctor.   Medication Assistance: Organization         Address  Phone   Notes  [facility] Medication [facility] [address] [place], [place] [address] [phone] --Must be a resident of [facility] -- Must have NO insurance coverage whatsoever (no Medicaid/ Medicare, etc.) -- The pt. MUST have a primary care doctor that directs their care regularly and follows them in the community   [facility]  [phone]   [employer]  [phone]    Agencies that provide inexpensive medical care: Organization         Address  Phone   Notes  [facility]  [phone]   [facility]    [phone]   [facility] [address] [place], [place] [address] [phone]   [facility] [address], [place] [phone]   [facility]    [phone]   [facility]    [phone]   [facility]  [address], [place] Phone:  [phone], Fax:  [phone] Hours of Operation:  9 am - 6 pm, M-F.  Also accepts Medicaid/Medicare and self-pay.  [facility]  [address], [place] Phone: [phone], Fax: [phone]) [phone]. Hours of Operation:  8:30 am - 5:30 pm, M-F.  Also accepts Medicaid and self-pay.  [facility] [place] [address], [place] Phone: [phone]) [phone]   [facility] [address], [place], [place] [phone], Ext. [phone] Mondays & Thursdays: 7-9 AM.  First 15 patients are seen on a first come, first serve basis.    Medicaid-accepting [facility] Providers:  Organization         Address  Phone   Notes  [facility] [address], [place] [phone] Also accepts self-pay patients.  [facility] [address], [place]  [phone]  [phone]   [facility] [address], [place] [phone]   [facility] [address] [place] [address], [place] [phone]   Renaye RakersVeita Bland [address], [place]   [phone] Only accepts [place] patients after they have their name applied to their card.   Self-Pay (no insurance) in [place]:  Organization         Address  Phone   Notes  Sickle Cell Patients, [facility] [address], [place] [phone]   [facility] [address], [place] [phone]) [phone]   [facility]  [address], [place] [phone]   [facility]/Dr. Osei-Bonsu  [address] [place] [address], [place] or [phone], [place] [phone] Phone number for both [place] and [place] locations is the same.  [facility] [address], [place] [phone]   [facility] [place] [address] [place] [address], [place] or [address] [phone]   [facility] [address], [place] [phone], phone; [phone], fax Sees patients 1st and 3rd Saturday of every month.  Must not qualify for public or private insurance (i.e. Medicaid, Medicare, [place] Health Choice, Veterans' Benefits)  Household income should be no more than 200% of the poverty level The clinic cannot treat you if you are pregnant or think you are pregnant  Sexually transmitted diseases are not treated at the clinic.

## 2018-06-04 NOTE — ED Triage Notes (Signed)
Interpreter [id number] used.  Patient states he has had heart palpitations which began 2 days ago.  C/o swelling noted to left eye and face since yesterday morning.

## 2018-06-09 LAB — CULTURE, BLOOD (ROUTINE X 2)
Culture: NO GROWTH
Culture: NO GROWTH
Special Requests: ADEQUATE
Special Requests: ADEQUATE

## 2019-11-25 DIAGNOSIS — F17210 Nicotine dependence, cigarettes, uncomplicated: Secondary | ICD-10-CM | POA: Insufficient documentation

## 2020-03-26 IMAGING — CT CT NECK W/ CM
4 of 5 series · 14 of 33 positions shown, 17 images · IV contrast (omnipaque)
Comparison: CT and MRI Saturday August, 2010.

CLINICAL DATA: Left-sided face and neck swelling.

EXAM:
CT NECK WITH CONTRAST
TECHNIQUE: Multidetector CT imaging of the neck was performed using the
standard protocol following the bolus administration of intravenous
contrast.
CONTRAST:  75mL OMNIPAQUE IOHEXOL 300 MG/ML  SOLN

[Series 3: axial neck · axial · 0.44mm/px · 1 of 111 slices shown]
[im 28/111  bone]
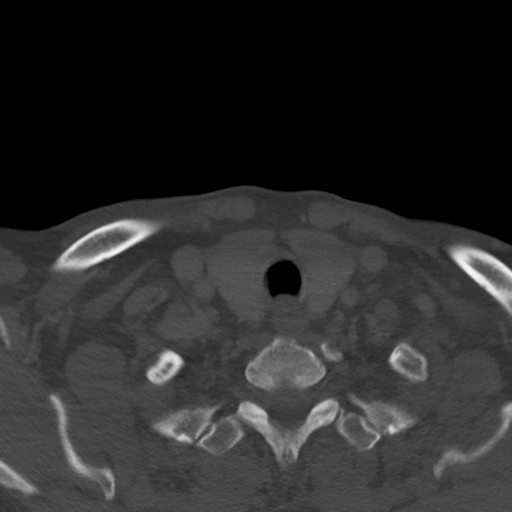

[Series 5: sag neck · sagittal · 0.46mm/px · 5 of 96 slices shown, 6 images]
[im 32/96  bone]
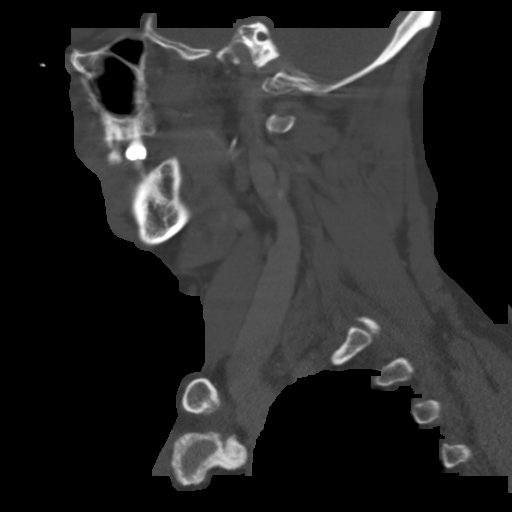
[im 40/96  bone]
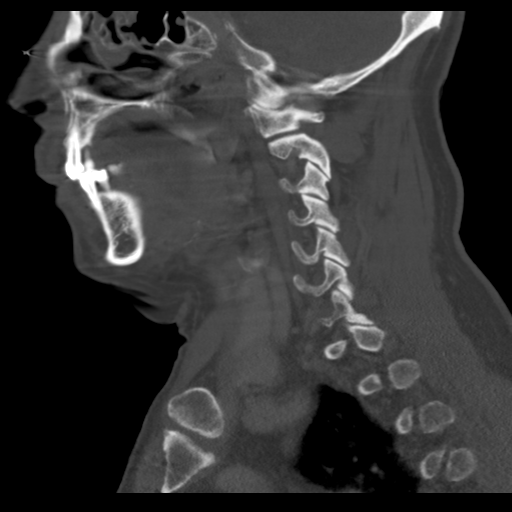
[im 48/96  soft-tissue]
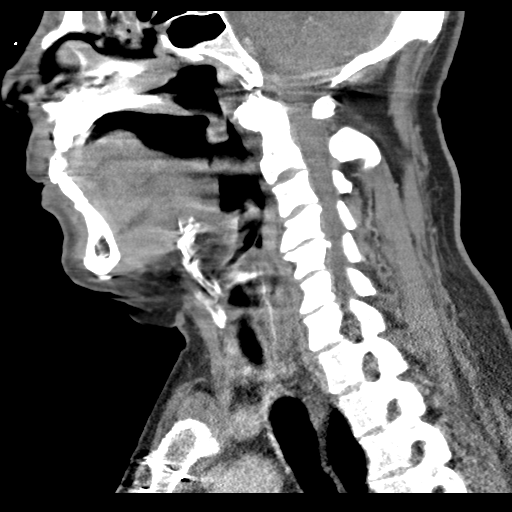
[im 48/96  bone]
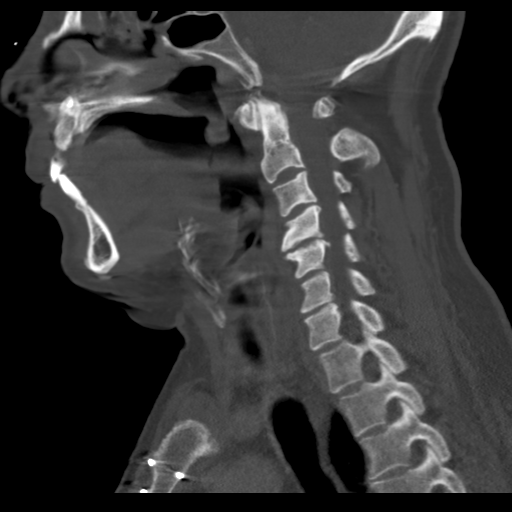
[im 56/96  bone]
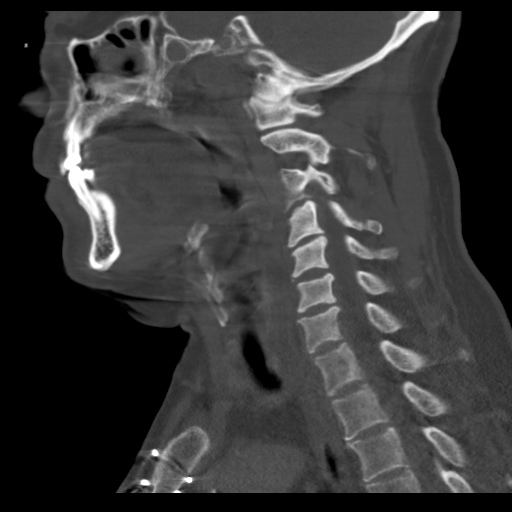
[im 64/96  bone]
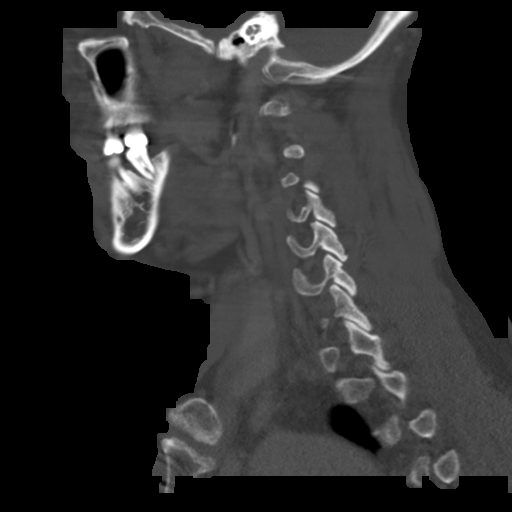

[Series 6: cor neck · coronal · 0.39mm/px · 3 of 115 slices shown]
[im 23/115  bone]
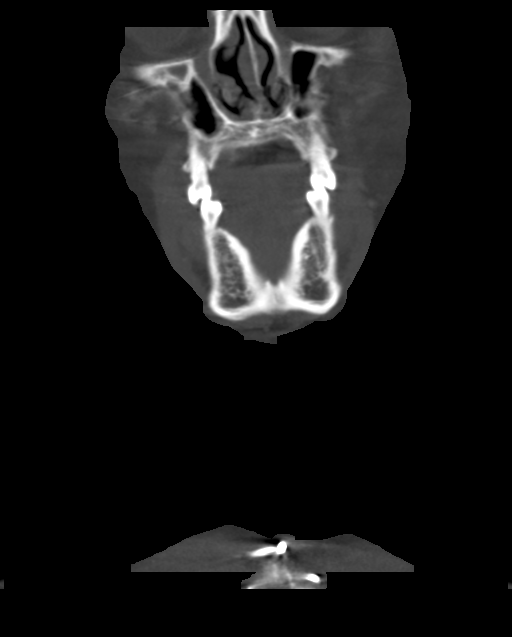
[im 46/115  bone]
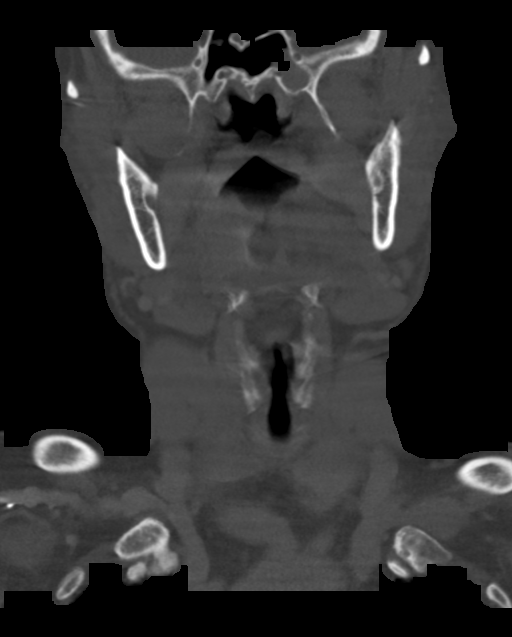
[im 69/115  bone]
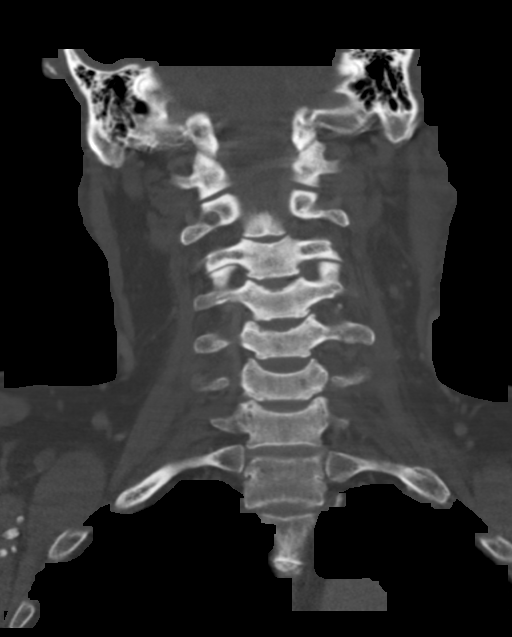

[Series 7: orthogonal ax · axial · 0.40mm/px · z∈[-306,-128]mm · 5 of 137 slices shown, 7 images]
[im 23/137  soft-tissue]
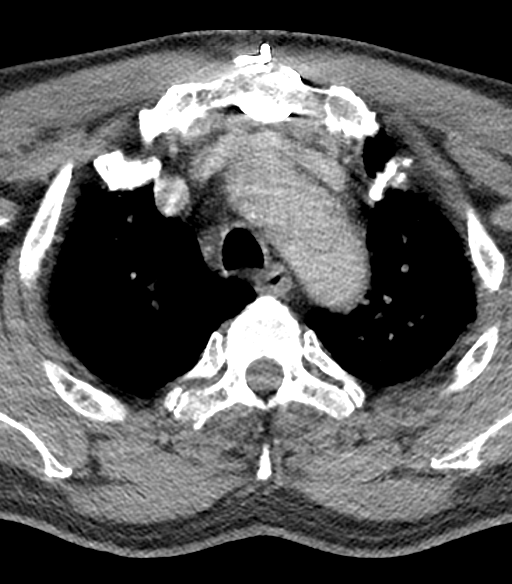
[im 23/137  bone]
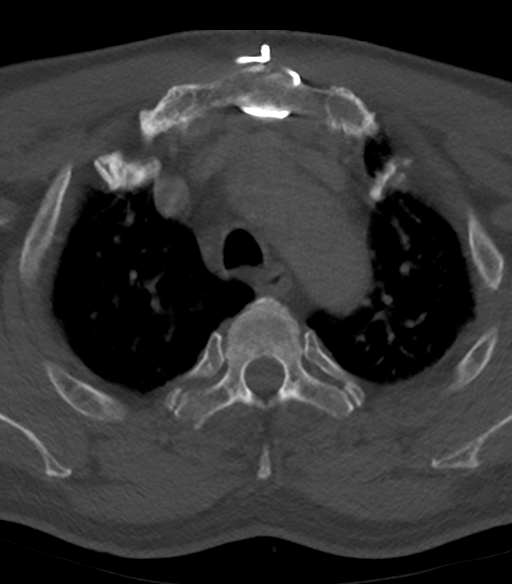
[im 46/137  bone]
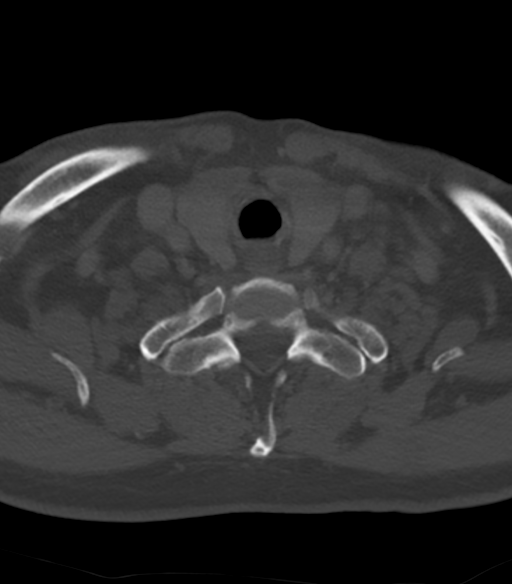
[im 69/137  bone]
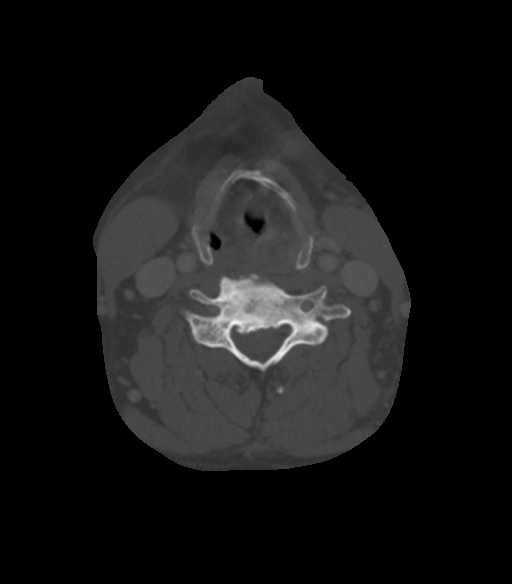
[im 91/137  bone]
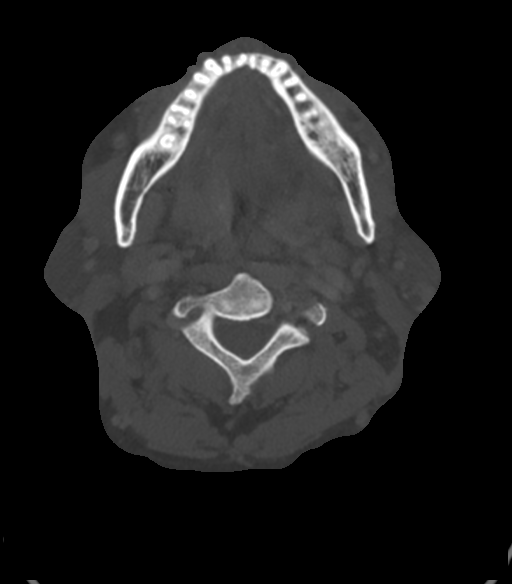
[im 114/137  soft-tissue]
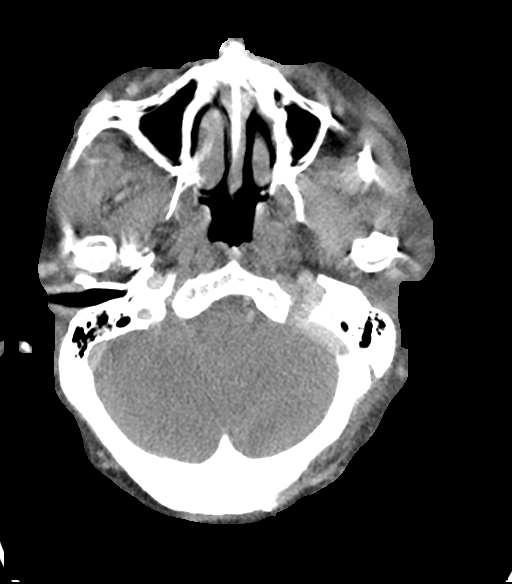
[im 114/137  bone]
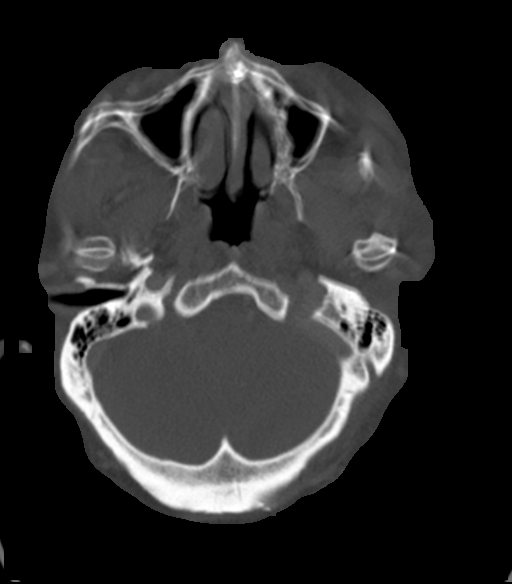

[14 of 33 positions shown; findings below may reference images not displayed]

FINDINGS: The study suffers from motion degradation.

Pharynx and larynx: No mucosal or submucosal lesion is seen allowing
for the motion.

Salivary glands: Right parotid and submandibular glands are normal.
Question mild swelling and enhancement of the left parotid gland
with mild edema in the soft tissue planes of the left neck which
could go along with left parotid sialoadenitis. I do not see
evidence of a mass or stone. No sign of an abscess. Left
submandibular gland is normal.

Thyroid: Normal

Lymph nodes: No enlarged or low-density nodes on either side of the
neck.

Vascular: No arterial or vascular occlusion identified. Minimal
atherosclerotic calcification at the carotid bifurcations.

Limited intracranial: Normal

Visualized orbits: Normal

Mastoids and visualized paranasal sinuses: Scattered opacified
ethmoid air cells. No advanced sinusitis. Mastoids are clear.

Skeleton: Ordinary mid cervical spondylosis.

Upper chest: Negative

Other: None
IMPRESSION: Subtle asymmetry of the left parotid gland compared to the right,
suggesting mild parotid sialoadenitis. Some nonspecific soft tissue
edema in the left side of the neck. No evidence of an abscess. No
evidence of a mass or stone disease.

## 2020-06-26 ENCOUNTER — Other Ambulatory Visit: Payer: Self-pay

## 2020-06-27 ENCOUNTER — Other Ambulatory Visit: Payer: Self-pay

## 2020-06-27 ENCOUNTER — Encounter: Payer: Self-pay | Admitting: Medical

## 2020-06-27 ENCOUNTER — Telehealth: Payer: Self-pay | Admitting: Medical

## 2020-06-27 ENCOUNTER — Other Ambulatory Visit (HOSPITAL_COMMUNITY): Payer: Self-pay | Admitting: Medical

## 2020-06-27 ENCOUNTER — Ambulatory Visit (INDEPENDENT_AMBULATORY_CARE_PROVIDER_SITE_OTHER): Payer: Medicare Other | Admitting: Medical

## 2020-06-27 VITALS — BP 126/55 | HR 60 | Temp 97.6°F | Ht 66.0 in | Wt 170.8 lb

## 2020-06-27 DIAGNOSIS — Z23 Encounter for immunization: Secondary | ICD-10-CM | POA: Diagnosis not present

## 2020-06-27 DIAGNOSIS — K219 Gastro-esophageal reflux disease without esophagitis: Secondary | ICD-10-CM | POA: Diagnosis not present

## 2020-06-27 DIAGNOSIS — H00014 Hordeolum externum left upper eyelid: Secondary | ICD-10-CM

## 2020-06-27 DIAGNOSIS — E785 Hyperlipidemia, unspecified: Secondary | ICD-10-CM | POA: Diagnosis not present

## 2020-06-27 DIAGNOSIS — E1169 Type 2 diabetes mellitus with other specified complication: Secondary | ICD-10-CM | POA: Diagnosis not present

## 2020-06-27 DIAGNOSIS — E1165 Type 2 diabetes mellitus with hyperglycemia: Secondary | ICD-10-CM | POA: Insufficient documentation

## 2020-06-27 DIAGNOSIS — Z1211 Encounter for screening for malignant neoplasm of colon: Secondary | ICD-10-CM

## 2020-06-27 DIAGNOSIS — I2510 Atherosclerotic heart disease of native coronary artery without angina pectoris: Secondary | ICD-10-CM

## 2020-06-27 DIAGNOSIS — Z87891 Personal history of nicotine dependence: Secondary | ICD-10-CM

## 2020-06-27 DIAGNOSIS — E119 Type 2 diabetes mellitus without complications: Secondary | ICD-10-CM | POA: Insufficient documentation

## 2020-06-27 DIAGNOSIS — R739 Hyperglycemia, unspecified: Secondary | ICD-10-CM

## 2020-06-27 LAB — COMPREHENSIVE METABOLIC PANEL
ALT: 32 U/L (ref 0–53)
AST: 22 U/L (ref 0–37)
Albumin: 4.5 g/dL (ref 3.5–5.2)
Alkaline Phosphatase: 103 U/L (ref 39–117)
BUN: 14 mg/dL (ref 6–23)
CO2: 28 mEq/L (ref 19–32)
Calcium: 9.5 mg/dL (ref 8.4–10.5)
Chloride: 105 mEq/L (ref 96–112)
Creatinine, Ser: 0.79 mg/dL (ref 0.40–1.50)
GFR: 91.53 mL/min (ref >60.00)
Glucose, Bld: 102 mg/dL — ABNORMAL HIGH (ref 70–99)
Potassium: 4.5 mEq/L (ref 3.5–5.1)
Sodium: 139 mEq/L (ref 135–145)
Total Bilirubin: 0.7 mg/dL (ref 0.2–1.2)
Total Protein: 7.2 g/dL (ref 6.0–8.3)

## 2020-06-27 LAB — LIPID PANEL
Cholesterol: 149 mg/dL (ref 0–200)
HDL: 39.8 mg/dL (ref >39.00)
LDL Cholesterol: 80 mg/dL (ref 0–99)
NonHDL: 109.16
Total CHOL/HDL Ratio: 4
Triglycerides: 145 mg/dL (ref 0.0–149.0)
VLDL: 29 mg/dL (ref 0.0–40.0)

## 2020-06-27 LAB — HEMOGLOBIN A1C: Hgb A1c MFr Bld: 6.3 % (ref 4.6–6.5)

## 2020-06-27 MED ORDER — METOPROLOL SUCCINATE ER 25 MG PO TB24: 25.0000 mg | ORAL_TABLET | Freq: Every day | ORAL | 1 refills | Status: DC

## 2020-06-27 MED ORDER — TOBRAMYCIN 0.3 % OP SOLN: 2.0000 [drp] | Freq: Four times a day (QID) | OPHTHALMIC | 0 refills | Status: DC

## 2020-06-27 MED ORDER — METFORMIN HCL 500 MG PO TABS: 500.0000 mg | ORAL_TABLET | Freq: Every day | ORAL | 3 refills | Status: DC

## 2020-06-27 MED FILL — TOBRAMYCIN 0.3 % SOLN: 0.3 | 13 days supply | Qty: 5 | Fill #0

## 2020-06-27 MED FILL — METOPROLOL SUCCINATE ER 25: 25 | 30 days supply | Qty: 30 | Fill #0

## 2020-06-27 NOTE — Patient Instructions (Addendum)
History of coronary artery disease and CABG.  Made referral to cardiologist to establish care/evaluate and treat.  History of hyperlipidemia with above history.  Continue atorvastatin 80 mg daily and aspirin.  History of elevated sugar 2 years ago in the 300 range.  This does indicate diabetes.  Will get A1c level today to confirm.  Based on level might refer to endocrinologist.  To be determined treatment.  Potential oral medications and possible injectable medication/insulin.  PCV 13 pneumonia vaccine today.  Did place referral to gastroenterologist to determine if colonoscopy needed.  On review records indicate may have had one in 2019 but I do not see the report in care everywhere.  Also GI will determine if endoscopy needed based on current history as well as patient concern for H. pylori.  To evaluate for H. pylori GI might recommend blood test or breath test.  History of GERD but intermittent/rare.  If becomes daily then recommend over-the-counter omeprazole.  History of smoking placed order for CT chest screening lung cancer.  Can go downstairs to radiology to get scheduled for that.  Left upper lid probable stye.  Prescription of Tobrex given today.  Do warm compresses twice daily.  Update [place] on this Friday or Monday.  If not resolving completely gone refer to ophthalmologist.  Follow-up in 1 month or sooner if needed.

## 2020-06-27 NOTE — Progress Notes (Signed)
Subjective:    Patient ID: Peter Wong, male    DOB: April 10, 1950, 70 y.o.   MRN: [id number]  HPI Pt in for first time.   Pt is fasting today. Pt used to got to [facility].      Hx of CAD and CABG x 4. Pt has not seen cardiology for some time per friendd translating for him.  2 years ago his sugar in February sugar was 313. Pt never been on medication per his report. He was not made  aware of such high level or does not remember?   No report of any hyperglycemic signs or symptoms today.   05-2017 may have had colonoscopy but no report seen on epic review.  Pt has some occasional reflux with spicy foods. His food is spicy. Pt never been treated for h pylori.    Review of Systems  Constitutional: Negative for chills, fatigue and fever.  HENT: Negative for congestion, drooling, ear discharge and ear pain.   Respiratory: Negative for cough, chest tightness, shortness of breath and wheezing.   Cardiovascular: Negative for chest pain and palpitations.  Musculoskeletal: Negative for back pain.  Skin: Negative for rash.  Neurological: Negative for facial asymmetry, speech difficulty, weakness, light-headedness and numbness.  Hematological: Negative for adenopathy. Does not bruise/bleed easily.  Psychiatric/Behavioral: Negative for behavioral problems, confusion and dysphoric mood.    Past Medical History:  Diagnosis Date  . Diabetes mellitus without complication (HCC)   . Hyperlipidemia   . Hypertension      Social History   Socioeconomic History  . Marital status: Married    Spouse name: Not on file  . Number of children: Not on file  . Years of education: Not on file  . Highest education level: Not on file  Occupational History  . Not on file  Tobacco Use  . Smoking status: Current Every Day Smoker    Types: Cigarettes  . Smokeless tobacco: Never Used  Substance and Sexual Activity  . Alcohol use: Not on file  . Drug use: Never  . Sexual activity: Not Currently  Other  Topics Concern  . Not on file  Social History Narrative  . Not on file   Social Determinants of Health   Financial Resource Strain: Not on file  Food Insecurity: Not on file  Transportation Needs: Not on file  Physical Activity: Not on file  Stress: Not on file  Social Connections: Not on file  Intimate Partner Violence: Not on file    Past Surgical History:  Procedure Laterality Date  . CORONARY ARTERY BYPASS GRAFT      No family history on file.  No Known Allergies  Current Outpatient Medications on File Prior to Visit  Medication Sig Dispense Refill  . aspirin 81 MG chewable tablet Chew by mouth.    Marland Kitchen atorvastatin (LIPITOR) 80 MG tablet Take 1 tablet by mouth daily.    Marland Kitchen amoxicillin-clavulanate (AUGMENTIN) 875-125 MG tablet Take 1 tablet by mouth every 12 (twelve) hours. (Patient not taking: Reported on 06/27/2020) 14 tablet 0  . METOPROLOL SUCCINATE PO Take by mouth.     No current facility-administered medications on file prior to visit.    BP (!) 126/55   Pulse 60   Temp 97.6 F (36.4 C)   Ht 5\' 6"  (1.676 m)   Wt 170 lb 12.8 oz (77.5 kg)   SpO2 100%   BMI 27.57 kg/m       Objective:   Physical Exam  General Mental Status-  Alert. General Appearance- Not in acute distress.   Skin General: Color- Normal Color. Moisture- Normal Moisture.  Neck Carotid Arteries- Normal color. Moisture- Normal Moisture. No carotid bruits. No JVD.  Chest and Lung Exam Auscultation: Breath Sounds:-Normal.  Cardiovascular Auscultation:Rythm- Regular. Murmurs & Other Heart Sounds:Auscultation of the heart reveals- No Murmurs.  Abdomen Inspection:-Inspeection Normal. Palpation/Percussion:Note:No mass. Palpation and Percussion of the abdomen reveal- Non Tender, Non Distended + BS, no rebound or guarding.    Neurologic Cranial Nerve exam:- CN III-XII intact(No nystagmus), symmetric smile. Drift Test:- No drift. Romberg Exam:- Negative.  Heal to Toe Gait  exam:-Normal. Finger to Nose:- Normal/Intact Strength:- 5/5 equal and symmetric strength both upper and lower extremities.  Eyes- peerl, left upper eye lid feels like moderate sized stye.    Assessment & Plan:  History of coronary artery disease and CABG.  Made referral to cardiologist to establish care/evaluate and treat.  History of hyperlipidemia with above history.  Continue atorvastatin 80 mg daily and aspirin.  History of elevated sugar 2 years ago in the 300 range.  This does indicate diabetes.  Will get A1c level today to confirm.  Based on level might refer to endocrinologist.  To be determined treatment.  Potential oral medications and possible injectable medication/insulin.  PCV 13 pneumonia vaccine today.  Did place referral to gastroenterologist to determine if colonoscopy needed.  On review records indicate may have had one in 2019 but I do not see the report in care everywhere.  Also GI will determine if endoscopy needed based on current history as well as patient concern for H. pylori.  To evaluate for H. pylori GI might recommend blood test or breath test.  History of GERD but intermittent/rare.  If becomes daily then recommend over-the-counter omeprazole.  History of smoking placed order for CT chest screening lung cancer.  Can go downstairs to radiology to get scheduled for that.  Left upper lid probable stye.  Prescription of Tobrex given today.  Do warm compresses twice daily.  Update [place] on this Friday or Monday.  If not resolving completely gone refer to ophthalmologist.  Follow-up in 1 month or sooner if needed.  Esperanza Richters, PA-C   Time spent with new  patient  today was 45  minutes which consisted of chart review, discussing diagnosis, work up, treatment and documentation.

## 2020-06-27 NOTE — Addendum Note (Signed)
Addended by: Maximino Sarin on: 06/27/2020 10:45 AM   Modules accepted: Orders

## 2020-06-27 NOTE — Telephone Encounter (Signed)
Rx metformin sent to pharmacy. 

## 2020-06-28 LAB — MICROALBUMIN, URINE: Microalb, Ur: 5.9 mg/dL

## 2020-06-28 MED FILL — METFORMIN HCL 500 MG TABS: 500 | 30 days supply | Qty: 30 | Fill #0

## 2020-06-30 ENCOUNTER — Telehealth: Payer: Self-pay | Admitting: Medical

## 2020-06-30 DIAGNOSIS — H0019 Chalazion unspecified eye, unspecified eyelid: Secondary | ICD-10-CM

## 2020-06-30 NOTE — Telephone Encounter (Signed)
Referral to ophthalmologist placed.

## 2020-07-03 DIAGNOSIS — I10 Essential (primary) hypertension: Secondary | ICD-10-CM | POA: Insufficient documentation

## 2020-07-03 DIAGNOSIS — E785 Hyperlipidemia, unspecified: Secondary | ICD-10-CM | POA: Insufficient documentation

## 2020-07-03 DIAGNOSIS — E119 Type 2 diabetes mellitus without complications: Secondary | ICD-10-CM | POA: Insufficient documentation

## 2020-07-04 ENCOUNTER — Encounter: Payer: Self-pay | Admitting: Gastroenterology

## 2020-07-05 ENCOUNTER — Telehealth: Payer: Self-pay | Admitting: Medical

## 2020-07-05 ENCOUNTER — Ambulatory Visit (HOSPITAL_BASED_OUTPATIENT_CLINIC_OR_DEPARTMENT_OTHER)
Admission: RE | Admit: 2020-07-05 | Discharge: 2020-07-05 | Disposition: A | Discharge status: Home / Self Care | Payer: Medicare Other | Source: Ambulatory Visit | Attending: Medical | Admitting: Medical

## 2020-07-05 ENCOUNTER — Other Ambulatory Visit: Payer: Self-pay

## 2020-07-05 DIAGNOSIS — R918 Other nonspecific abnormal finding of lung field: Secondary | ICD-10-CM

## 2020-07-05 DIAGNOSIS — R911 Solitary pulmonary nodule: Secondary | ICD-10-CM

## 2020-07-05 DIAGNOSIS — Z87891 Personal history of nicotine dependence: Secondary | ICD-10-CM | POA: Diagnosis not present

## 2020-07-05 NOTE — Telephone Encounter (Signed)
Opened to place referral to pulmonologist.

## 2020-07-17 NOTE — Progress Notes (Signed)
Cardiology Office Note:    Date:  07/18/2020   ID:  Peter Wong, Peter Wong 12/29/1950, MRN [id number]  PCP:  Esperanza Richters, PA-C  Cardiologist:  Norman Herrlich, MD   Referring MD: Esperanza Richters, PA-C  ASSESSMENT:    1. Coronary artery disease involving native coronary artery of native heart without angina pectoris   2. Hx of CABG   3. Mixed hyperlipidemia   4. Primary hypertension    PLAN:    In order of problems listed above:  1. In general he has done well since the time of bypass surgery having no angina on current medical therapy including aspirin beta-blocker and high intensity statin.  Opportunities to improve care include smoking cessation and initiation of regular exercise program.  If his LDL remains 70 after 3 to 6 months with regular activity I would not intensify therapy with Zetia or PCSK9 inhibitor. 2. BP at target continue beta-blocker 3. He is committed to smoking cessation start  nicotine substitutes 4. I advised Mucinex over-the-counter personally point productive cough 5. Encouraged 20 minutes of activity a day have told since that time religious activity  Next appointment 6 months   Medication Adjustments/Labs and Tests Ordered: Current medicines are reviewed at length with the patient today.  Concerns regarding medicines are outlined above.  No orders of the defined types were placed in this encounter.  No orders of the defined types were placed in this encounter.    No chief complaint on file.   History of Present Illness:    Peter Wong is a 70 y.o. male who is being seen today for the evaluation of CAD with a history of CABG at the request of [place], Ramon [place]red[place], [place].  He was seen at [facility] cardiology 10/21/2019 medical history reveals CABG at  [facility] 2016 an echocardiogram performed 11/02/2019 at [facility] showing normal left ventricular size and function diastolic dysfunction with elevated left atrial pressure mild left atrial enlargement  and mild mitral regurgitation.  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  He had CT of the chest lung cancer screening 07/05/2020 dominant 7 mm right middle lobe pulmonary nodule he had patchy groundglass opacity and scattered septal thickening throughout both lungs mild bronchiectasis findings were consistent with interstitial lung disease also has aortic atherosclerosis and emphysema.  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 He has chronic sputum production of the morning and has emphysema He has GERD and his family member tells me he has Helicobacter and is seeing GI. He continues to smoke.  Past Medical History:  Diagnosis Date  . Diabetes mellitus without complication (HCC)   . Hyperlipidemia   . Hypertension     Past Surgical History:  Procedure Laterality Date  . CORONARY ARTERY BYPASS GRAFT  10/2014    Current Medications: Current Meds  Medication Sig  . aspirin 81 MG chewable tablet Chew 1 tablet by mouth 1 day or 1 dose.  Marland Kitchen atorvastatin (LIPITOR) 80 MG tablet Take 1 tablet by mouth daily.  . metFORMIN (GLUCOPHAGE) 500 MG tablet Take 1 tablet (500 mg total) by  mouth daily with breakfast.  . metoprolol succinate (TOPROL-XL) 25 MG 24 hr tablet Take 1 tablet (25 mg total) by mouth daily.  Marland Kitchen tobramycin (TOBREX) 0.3 % ophthalmic solution Place 2 drops into the left eye every 6 (six) hours.     Allergies:   Patient has no known allergies.   Social History   Socioeconomic History  . Marital status: Married    Spouse name: Not on file  . Number of  children: Not on file  . Years of education: Not on file  . Highest education level: Not on file  Occupational History  . Not on file  Tobacco Use  . Smoking status: Current Every Day Smoker    Packs/day: 1.00    Years: 40.00    Pack years: 40.00    Types: Cigarettes    Last attempt to quit: 04/29/2016    Years since quitting: 4.2  . Smokeless tobacco: Never Used  Vaping Use  . Vaping Use: Never used  Substance and Sexual Activity  . Alcohol use: Not Currently  . Drug use: Never  . Sexual activity: Not Currently  Other Topics Concern  . Not on file  Social History Narrative  . Not on file   Social Determinants of Health   Financial Resource Strain: Not on file  Food Insecurity: Not on file  Transportation Needs: Not on file  Physical Activity: Not on file  Stress: Not on file  Social Connections: Not on file     Family History: The patient's family history includes Lung cancer in his mother.  ROS:   ROS Please see the history of present illness.    l all other systems reviewed and are negative.  EKGs/Labs/Other Studies Reviewed:    The following studies were reviewed today:   EKG:  EKG is  ordered today.  The ekg ordered today is personally reviewed and demonstrates sinus rhythm  Recent Labs: 06/27/2020: ALT 32; BUN 14; Creatinine, Ser 0.79; Potassium 4.5; Sodium 139  Recent Lipid Panel    Component Value Date/Time   CHOL 149 06/27/2020 1016   TRIG 145.0 06/27/2020 1016   HDL 39.80 06/27/2020 1016   CHOLHDL 4 06/27/2020 1016   VLDL 29.0 06/27/2020 1016   LDLCALC 80 06/27/2020 1016    Physical Exam:    VS:  BP 120/68 (BP Location: Right Arm, Patient Position: Sitting, Cuff Size: Normal)   Ht 5\' 6"  (1.676 m)   Wt 171 lb (77.6 kg)   BMI 27.60 kg/m     Wt Readings from Last 3 Encounters:  07/18/20 171 lb (77.6 kg)  06/27/20 170 lb 12.8 oz (77.5 kg)  06/04/18 178 lb (80.7 kg)     GEN:  Well nourished, well developed in no acute distress HEENT:  Normal NECK: No JVD; No carotid bruits LYMPHATICS: No lymphadenopathy CARDIAC: RRR, no murmurs, rubs, gallops RESPIRATORY:  Clear to auscultation without rales, wheezing or rhonchi  ABDOMEN: Soft, non-tender, non-distended no suggestion of aortic aneurysm no bruit no pulsation MUSCULOSKELETAL:  No edema; No deformity  SKIN: Warm and dry NEUROLOGIC:  Alert and oriented x 3 PSYCHIATRIC:  Normal affect     Signed, 06/06/18, MD  07/18/2020 9:35 AM    [facility]

## 2020-07-18 ENCOUNTER — Encounter: Payer: Self-pay | Admitting: Cardiology

## 2020-07-18 ENCOUNTER — Telehealth: Payer: Self-pay | Admitting: Medical

## 2020-07-18 ENCOUNTER — Ambulatory Visit: Payer: Medicare Other | Admitting: Cardiology

## 2020-07-18 ENCOUNTER — Other Ambulatory Visit (HOSPITAL_BASED_OUTPATIENT_CLINIC_OR_DEPARTMENT_OTHER): Payer: Self-pay

## 2020-07-18 ENCOUNTER — Other Ambulatory Visit: Payer: Self-pay

## 2020-07-18 VITALS — BP 120/68 | HR 66 | Ht 66.0 in | Wt 171.0 lb

## 2020-07-18 DIAGNOSIS — I2510 Atherosclerotic heart disease of native coronary artery without angina pectoris: Secondary | ICD-10-CM | POA: Diagnosis not present

## 2020-07-18 DIAGNOSIS — Z951 Presence of aortocoronary bypass graft: Secondary | ICD-10-CM | POA: Diagnosis not present

## 2020-07-18 DIAGNOSIS — I10 Essential (primary) hypertension: Secondary | ICD-10-CM | POA: Diagnosis not present

## 2020-07-18 DIAGNOSIS — E782 Mixed hyperlipidemia: Secondary | ICD-10-CM

## 2020-07-18 MED ORDER — NICOTINE 21 MG/24HR TD PT24
21.0000 mg | MEDICATED_PATCH | Freq: Every day | TRANSDERMAL | 1 refills | Status: DC
  Filled 2020-07-18: qty 28, 28d supply, fill #0

## 2020-07-18 MED ORDER — ATORVASTATIN CALCIUM 80 MG PO TABS
1.0000 | ORAL_TABLET | Freq: Every day | ORAL | 1 refills | Status: DC
  Filled 2020-07-18: qty 90, 90d supply, fill #0

## 2020-07-18 NOTE — Telephone Encounter (Signed)
Rx atorvastatin sent to pt pharmacy. 

## 2020-07-18 NOTE — Patient Instructions (Addendum)
Medication Instructions:  Your physician has recommended you make the following change in your medication:  START: Nicotine patch 21 mg apply one patch to the skin every day in the morning.  *If you need a refill on your cardiac medications before your next appointment, please call your pharmacy*   Lab Work: None If you have labs (blood work) drawn today and your tests are completely normal, you will receive your results only by: Marland Kitchen MyChart Message (if you have MyChart) OR . A paper copy in the mail If you have any lab test that is abnormal or we need to change your treatment, we will call you to review the results.   Testing/Procedures: None   Follow-Up: At [facility], you and your health needs are our priority.  As part of our continuing mission to provide you with exceptional heart care, we have created designated Provider Care Teams.  These Care Teams include your primary Cardiologist (physician) and Advanced Practice Providers (APPs -  Physician Assistants and Nurse Practitioners) who all work together to provide you with the care you need, when you need it.  We recommend signing up for the patient portal called "MyChart".  Sign up information is provided on this After Visit Summary.  MyChart is used to connect with patients for Virtual Visits (Telemedicine).  Patients are able to view lab/test results, encounter notes, upcoming appointments, etc.  Non-urgent messages can be sent to your provider as well.   To learn more about what you can do with MyChart, go to [email or web address].    Your next appointment:   1 year(s)  The format for your next appointment:   In Person  Provider:   Norman Herrlich, MD   Other Instructions  If unable to stop smoking send me a message via MyChart I will place him on [place]ix  Start a regular activity program walking swimming cycling 20 to 30 minutes/day to avoid recurrent cardiac problems and need for further procedures Steps to Quit  Smoking Smoking tobacco is the leading cause of preventable death. It can affect almost every organ in the body. Smoking puts you and those around you at risk for developing many serious chronic diseases. Quitting smoking can be difficult, but it is one of the best things that you can do for your health. It is never too late to quit. How do I get ready to quit? When you decide to quit smoking, create a plan to help you succeed. Before you quit:  Pick a date to quit. Set a date within the next 2 weeks to give you time to prepare.  Write down the reasons why you are quitting. Keep this list in places where you will see it often.  Tell your family, friends, and co-workers that you are quitting. Support from your loved ones can make quitting easier.  Talk with your health care provider about your options for quitting smoking.  Find out what treatment options are covered by your health insurance.  Identify people, places, things, and activities that make you want to smoke (triggers). Avoid them. What first steps can I take to quit smoking?  Throw away all cigarettes at home, at work, and in your car.  Throw away smoking accessories, such as Set designer.  Clean your car. Make sure to empty the ashtray.  Clean your home, including curtains and carpets. What strategies can I use to quit smoking? Talk with your health care provider about combining strategies, such as taking medicines while you are  also receiving in-person counseling. Using these two strategies together makes you more likely to succeed in quitting than if you used either strategy on its own.  If you are pregnant or breastfeeding, talk with your health care provider about finding counseling or other support strategies to quit smoking. Do not take medicine to help you quit smoking unless your health care provider tells you to do so. To quit smoking: Quit right away  Quit smoking completely, instead of gradually reducing  how much you smoke over a period of time. Research shows that stopping smoking right away is more successful than gradually quitting.  Attend in-person counseling to help you build problem-solving skills. You are more likely to succeed in quitting if you attend counseling sessions regularly. Even short sessions of 10 minutes can be effective. Take medicine You may take medicines to help you quit smoking. Some medicines require a prescription and some you can purchase over-the-counter. Medicines may have nicotine in them to replace the nicotine in cigarettes. Medicines may:  Help to stop cravings.  Help to relieve withdrawal symptoms. Your health care provider may recommend:  Nicotine patches, gum, or lozenges.  Nicotine inhalers or sprays.  Non-nicotine medicine that is taken by mouth. Find resources Find resources and support systems that can help you to quit smoking and remain smoke-free after you quit. These resources are most helpful when you use them often. They include:  Online chats with a Veterinary surgeon.  Telephone quitlines.  Printed Materials engineer.  Support groups or group counseling.  Text messaging programs.  Mobile phone apps or applications. Use apps that can help you stick to your quit plan by providing reminders, tips, and encouragement. There are many free apps for mobile devices as well as websites. Examples include Quit Guide from the [employer] and smokefree.gov   What things can I do to make it easier to quit?  Reach out to your family and friends for support and encouragement. Call telephone quitlines (1-800-QUIT-NOW), reach out to support groups, or work with a counselor for support.  Ask people who smoke to avoid smoking around you.  Avoid places that trigger you to smoke, such as bars, parties, or smoke-break areas at work.  Spend time with people who do not smoke.  Lessen the stress in your life. Stress can be a smoking trigger for some people. To lessen  stress, try: ? Exercising regularly. ? Doing deep-breathing exercises. ? Doing yoga. ? Meditating. ? Performing a body scan. This involves closing your eyes, scanning your body from head to toe, and noticing which parts of your body are particularly tense. Try to relax the muscles in those areas.   How will I feel when I quit smoking? Day 1 to 3 weeks Within the first 24 hours of quitting smoking, you may start to feel withdrawal symptoms. These symptoms are usually most noticeable 2-3 days after quitting, but they usually do not last for more than 2-3 weeks. You may experience these symptoms:  Mood swings.  Restlessness, anxiety, or irritability.  Trouble concentrating.  Dizziness.  Strong cravings for sugary foods and nicotine.  Mild weight gain.  Constipation.  Nausea.  Coughing or a sore throat.  Changes in how the medicines that you take for unrelated issues work in your body.  Depression.  Trouble sleeping (insomnia). Week 3 and afterward After the first 2-3 weeks of quitting, you may start to notice more positive results, such as:  Improved sense of smell and taste.  Decreased coughing and sore  throat.  Slower heart rate.  Lower blood pressure.  Clearer skin.  The ability to breathe more easily.  Fewer sick days. Quitting smoking can be very challenging. Do not get discouraged if you are not successful the first time. Some people need to make many attempts to quit before they achieve long-term success. Do your best to stick to your quit plan, and talk with your health care provider if you have any questions or concerns. Summary  Smoking tobacco is the leading cause of preventable death. Quitting smoking is one of the best things that you can do for your health.  When you decide to quit smoking, create a plan to help you succeed.  Quit smoking right away, not slowly over a period of time.  When you start quitting, seek help from your health care  provider, family, or friends. This information is not intended to replace advice given to you by your health care provider. Make sure you discuss any questions you have with your health care provider. Document Revised: 12/18/2018 Document Reviewed: 06/13/2018 Elsevier Patient Education  2021 [employer].

## 2020-07-19 ENCOUNTER — Other Ambulatory Visit (HOSPITAL_BASED_OUTPATIENT_CLINIC_OR_DEPARTMENT_OTHER): Payer: Self-pay

## 2020-07-26 ENCOUNTER — Ambulatory Visit (INDEPENDENT_AMBULATORY_CARE_PROVIDER_SITE_OTHER): Payer: Medicare Other | Admitting: Medical

## 2020-07-26 ENCOUNTER — Other Ambulatory Visit: Payer: Self-pay | Admitting: Medical

## 2020-07-26 ENCOUNTER — Other Ambulatory Visit (HOSPITAL_BASED_OUTPATIENT_CLINIC_OR_DEPARTMENT_OTHER): Payer: Self-pay

## 2020-07-26 ENCOUNTER — Other Ambulatory Visit: Payer: Self-pay

## 2020-07-26 VITALS — BP 116/64 | HR 63 | Temp 98.2°F | Resp 20 | Ht 66.0 in | Wt 171.0 lb

## 2020-07-26 DIAGNOSIS — E785 Hyperlipidemia, unspecified: Secondary | ICD-10-CM

## 2020-07-26 DIAGNOSIS — F17200 Nicotine dependence, unspecified, uncomplicated: Secondary | ICD-10-CM

## 2020-07-26 DIAGNOSIS — H00014 Hordeolum externum left upper eyelid: Secondary | ICD-10-CM | POA: Diagnosis not present

## 2020-07-26 DIAGNOSIS — H02059 Trichiasis without entropian unspecified eye, unspecified eyelid: Secondary | ICD-10-CM

## 2020-07-26 DIAGNOSIS — I2510 Atherosclerotic heart disease of native coronary artery without angina pectoris: Secondary | ICD-10-CM | POA: Diagnosis not present

## 2020-07-26 DIAGNOSIS — E1169 Type 2 diabetes mellitus with other specified complication: Secondary | ICD-10-CM | POA: Diagnosis not present

## 2020-07-26 DIAGNOSIS — I10 Essential (primary) hypertension: Secondary | ICD-10-CM

## 2020-07-26 MED ORDER — GUAIFENESIN 400 MG PO TABS
ORAL_TABLET | ORAL | 3 refills | Status: DC
  Filled 2020-07-26: qty 60, 60d supply, fill #0

## 2020-07-26 MED ORDER — ATORVASTATIN CALCIUM 80 MG PO TABS
1.0000 | ORAL_TABLET | Freq: Every day | ORAL | 1 refills | Status: DC
  Filled 2020-07-26 – 2020-08-22 (×5): qty 90, 90d supply, fill #0
  Filled 2020-12-04: qty 90, 90d supply, fill #1

## 2020-07-26 MED ORDER — METOPROLOL SUCCINATE ER 25 MG PO TB24
1.0000 | ORAL_TABLET | Freq: Every day | ORAL | 1 refills | Status: DC
  Filled 2020-07-26: qty 90, 90d supply, fill #0
  Filled 2020-11-02: qty 90, 90d supply, fill #1

## 2020-07-26 MED ORDER — BUPROPION HCL ER (XL) 150 MG PO TB24
ORAL_TABLET | ORAL | 0 refills | Status: DC
  Filled 2020-07-26: qty 90, 90d supply, fill #0

## 2020-07-26 MED ORDER — METFORMIN HCL 500 MG PO TABS
500.0000 mg | ORAL_TABLET | Freq: Every day | ORAL | 3 refills | Status: DC
  Filled 2020-07-26: qty 90, 90d supply, fill #0
  Filled 2020-11-02: qty 90, 90d supply, fill #1
  Filled 2021-07-05: qty 90, 90d supply, fill #2

## 2020-07-26 NOTE — Progress Notes (Signed)
Subjective:    Patient ID: Peter Wong, male    DOB: 08/04/1950, 70 y.o.   MRN: [id number]  HPI  Pt in for follow up.(exam done with friend who translates)  Pt has seen Dr. Dulce Sellar.  Assessment:  . Coronary artery disease involving native coronary artery of native heart without angina pectoris   2. Hx of CABG   3. Mixed hyperlipidemia   4. Primary hypertension     Plan  In order of problems listed above:  1. In general he has done well since the time of bypass surgery having no angina on current medical therapy including aspirin beta-blocker and high intensity statin.  Opportunities to improve care include smoking cessation and initiation of regular exercise program.  If his LDL remains 70 after 3 to 6 months with regular activity I would not intensify therapy with Zetia or PCSK9 inhibitor. 2. BP at target continue beta-blocker 3. He is committed to smoking cessation start  nicotine substitutes 4. I advised Mucinex over-the-counter personally point productive cough 5. Encouraged 20 minutes of activity a day have told since that time religious activity  Pt eye lid now better after use of tobrex.. But pt does have some upper eye lid drooping both side and lashes turned inward. Pt wants referral to eye MD.    Hx of smoking. Pt is trying to quit smoking.   Pt is prediabetic. On metformin no side effects.    Review of Systems  Constitutional: Negative for chills, fatigue and fever.  HENT: Negative for congestion, ear discharge, ear pain and facial swelling.   Respiratory: Negative for cough, chest tightness, shortness of breath and wheezing.   Cardiovascular: Negative for chest pain and palpitations.  Gastrointestinal: Negative for abdominal pain.  Genitourinary: Negative for dysuria, flank pain and frequency.  Musculoskeletal: Negative for back pain, gait problem and myalgias.  Skin: Negative for rash.  Neurological: Negative for dizziness, numbness and headaches.   Hematological: Negative for adenopathy. Does not bruise/bleed easily.  Psychiatric/Behavioral: Negative for behavioral problems, confusion and suicidal ideas.    Past Medical History:  Diagnosis Date  . Diabetes mellitus without complication (HCC)   . Hyperlipidemia   . Hypertension      Social History   Socioeconomic History  . Marital status: Married    Spouse name: Not on file  . Number of children: Not on file  . Years of education: Not on file  . Highest education level: Not on file  Occupational History  . Not on file  Tobacco Use  . Smoking status: Current Every Day Smoker    Packs/day: 1.00    Years: 40.00    Pack years: 40.00    Types: Cigarettes    Last attempt to quit: 04/29/2016    Years since quitting: 4.2  . Smokeless tobacco: Never Used  Vaping Use  . Vaping Use: Never used  Substance and Sexual Activity  . Alcohol use: Not Currently  . Drug use: Never  . Sexual activity: Not Currently  Other Topics Concern  . Not on file  Social History Narrative  . Not on file   Social Determinants of Health   Financial Resource Strain: Not on file  Food Insecurity: Not on file  Transportation Needs: Not on file  Physical Activity: Not on file  Stress: Not on file  Social Connections: Not on file  Intimate Partner Violence: Not on file    Past Surgical History:  Procedure Laterality Date  . CORONARY ARTERY BYPASS GRAFT  10/2014    Family History  Problem Relation Age of Onset  . Lung cancer Mother     No Known Allergies  Current Outpatient Medications on File Prior to Visit  Medication Sig Dispense Refill  . aspirin 81 MG chewable tablet Chew 1 tablet by mouth daily.    . calcium-vitamin D (OSCAL WITH D) 500-200 MG-UNIT tablet Take 1 tablet by mouth.    . metoprolol succinate (TOPROL-XL) 25 MG 24 hr tablet Take 1 tablet (25 mg total) by mouth daily. 30 tablet 1  . nicotine (NICODERM CQ - DOSED IN MG/24 HOURS) 21 mg/24hr patch Place 1 patch (21 mg  total) onto the skin daily. 30 patch 1  . tobramycin (TOBREX) 0.3 % ophthalmic solution Place 2 drops into the left eye every 6 (six) hours. 5 mL 0   No current facility-administered medications on file prior to visit.    BP 116/64   Pulse 63   Temp 98.2 F (36.8 C)   Resp 20   Ht 5\' 6"  (1.676 m)   Wt 171 lb (77.6 kg)   SpO2 100%   BMI 27.60 kg/m      Objective:   Physical Exam  General Mental Status- Alert. General Appearance- Not in acute distress.   Skin General: Color- Normal Color. Moisture- Normal Moisture.  Neck Carotid Arteries- Normal color. Moisture- Normal Moisture. No carotid bruits. No JVD.  Chest and Lung Exam Auscultation: Breath Sounds:-Normal.  Cardiovascular Auscultation:Rythm- Regular. Murmurs & Other Heart Sounds:Auscultation of the heart reveals- No Murmurs.  Abdomen Inspection:-Inspeection Normal. Palpation/Percussion:Note:No mass. Palpation and Percussion of the abdomen reveal- Non Tender, Non Distended + BS, no rebound or guarding.   Neurologic Cranial Nerve exam:- CN III-XII intact(No nystagmus), symmetric smile. Strength:- 5/5 equal and symmetric strength both upper and lower extremities.      Assessment & Plan:  For high cholesterol continue atorvastatin.  For htn continue metoprolol 25 mg daily. Bp good/controlled  Today.  For drooped eye lids referred to eye MD. Stye resolved left upper eye lid.  For history of smoking use nicotine patch. Can start  wellbutrin rx advisement. Explained potential metoprolol interaction. Over next 2 weeks check pulse with pulse ox. If pulse dropping to low 50 may need to stop wellbutrin. Notify 05-26-1992 any low pulses or low blood pressure.  For hx of smoking and pulonornary nodules as well as possible interstitial lung disease follow up with pulmonolgist in May.  For elevated blood sugar continue metformin and low sugar diet.  Follow up in 2-3 months or as needed  June, PA-C   Time spent  with patient today was 43  minutes which consisted of chart revdiew, discussing diagnosis, work up treatment and documentation.

## 2020-07-26 NOTE — Patient Instructions (Addendum)
For high cholesterol continue atorvastatin.  For htn continue metoprolol 25 mg daily. Bp good/controlled  Today.  For drooped eye lids referred to eye MD. Stye resolved left upper eye lid.  For history of smoking use nicotine patch. Can start  wellbutrin rx advisement. Explained potential metoprolol interaction. Over next 2 weeks check pulse with pulse ox. If pulse dropping to low 50 may need to stop wellbutrin. Notify us any low pulses or low blood pressure.  For hx of smoking and pulonornary nodules as well as possible interstitial lung disease follow up with pulmonolgist in May.  For elevated blood sugar continue metformin and low sugar diet.  Follow up in 2-3 months or as needed

## 2020-07-27 ENCOUNTER — Other Ambulatory Visit (HOSPITAL_BASED_OUTPATIENT_CLINIC_OR_DEPARTMENT_OTHER): Payer: Self-pay

## 2020-07-31 ENCOUNTER — Other Ambulatory Visit (HOSPITAL_BASED_OUTPATIENT_CLINIC_OR_DEPARTMENT_OTHER): Payer: Self-pay

## 2020-08-02 ENCOUNTER — Ambulatory Visit: Payer: Medicare Other | Admitting: Emergency Medicine

## 2020-08-02 ENCOUNTER — Encounter: Payer: Self-pay | Admitting: Emergency Medicine

## 2020-08-02 ENCOUNTER — Other Ambulatory Visit: Payer: Self-pay

## 2020-08-02 DIAGNOSIS — J849 Interstitial pulmonary disease, unspecified: Secondary | ICD-10-CM

## 2020-08-02 DIAGNOSIS — F17210 Nicotine dependence, cigarettes, uncomplicated: Secondary | ICD-10-CM | POA: Diagnosis not present

## 2020-08-02 DIAGNOSIS — R918 Other nonspecific abnormal finding of lung field: Secondary | ICD-10-CM | POA: Insufficient documentation

## 2020-08-02 NOTE — Patient Instructions (Addendum)
We will perform lab work today We will perform a high-resolution CT scan of the chest to evaluate lung inflammation and scarring We will arrange for pulmonary function testing in next office visit You will need a repeat lung cancer screening CT of the chest in late September 2022 to follow pulmonary nodules Congratulations on decreasing your smoking.  Continue Wellbutrin and continue to avoid cigarettes if at all possible. Follow with Dr. Delton Coombes next available with full pulmonary function testing on the same day.

## 2020-08-02 NOTE — Assessment & Plan Note (Signed)
Noted on his lung cancer screening CT.  He will be due for repeat lung cancer screening scan in September 2022.  We will arrange.

## 2020-08-02 NOTE — Assessment & Plan Note (Signed)
Broad differential diagnosis.  Consider RB-ILD and patient with significant tobacco history.  No clear history of autoimmune disease but this is a possibility as well. Plan to obtain a high-resolution CT scan of the chest without contrast now Send autoimmune labs, QuantiFERON gold, hypersensitivity pneumonitis panel, ACE level

## 2020-08-02 NOTE — Progress Notes (Signed)
Subjective:    Patient ID: Peter Wong, male    DOB: 10/22/1950, 70 y.o.   MRN: [id number]  HPI 70 year old smoker (40 pack years) with CAD/CABG, hypertension, diabetes, hyperlipidemia.  He has a family history of lung cancer in his mother.  He is referred today for abnormal lung cancer screening CT scan that was done on 07/05/2020.  He is working on smoking cessation, just started Wellbutrin. Also has patches. Denies any dyspnea, cough. Has a friend here with him who translates from [place]  CT chest 07/05/2020 reviewed by me, shows no mediastinal or hilar lymphadenopathy, mild centrilobular emphysema and diffuse bronchial wall thickening, extensive patchy groundglass and scattered septal thickening in both lungs most prominent in the lower lobes with some associated traction bronchiolectasis, a few scattered solid pulmonary nodules largest 7 mm in the right middle lobe   Review of Systems As per HPI  Past Medical History:  Diagnosis Date  . Diabetes mellitus without complication (HCC)   . Hyperlipidemia   . Hypertension    CAD / CABG  Family History  Problem Relation Age of Onset  . Lung cancer Mother      Social History   Socioeconomic History  . Marital status: Married    Spouse name: Not on file  . Number of children: Not on file  . Years of education: Not on file  . Highest education level: Not on file  Occupational History  . Not on file  Tobacco Use  . Smoking status: Former Smoker    Packs/day: 1.00    Years: 40.00    Pack years: 40.00    Types: Cigarettes    Quit date: 04/29/2016    Years since quitting: 4.2  . Smokeless tobacco: Never Used  . Tobacco comment: quit 3 days ago 08/02/20 ARJ   Vaping Use  . Vaping Use: Never used  Substance and Sexual Activity  . Alcohol use: Not Currently  . Drug use: Never  . Sexual activity: Not Currently  Other Topics Concern  . Not on file  Social History Narrative  . Not on file   Social Determinants of Health    Financial Resource Strain: Not on file  Food Insecurity: Not on file  Transportation Needs: Not on file  Physical Activity: Not on file  Stress: Not on file  Social Connections: Not on file  Intimate Partner Violence: Not on file    From [place], has lived locally since 2002.  Manages a store.  No explores, no water damage or mold.   No Known Allergies   Outpatient Medications Prior to Visit  Medication Sig Dispense Refill  . aspirin 81 MG chewable tablet Chew 1 tablet by mouth daily.    Marland Kitchen atorvastatin (LIPITOR) 80 MG tablet Take 1 tablet (80 mg total) by mouth daily. 90 tablet 1  . calcium-vitamin D (OSCAL WITH D) 500-200 MG-UNIT tablet Take 1 tablet by mouth.    . guaifenesin (HUMIBID E) 400 MG TABS tablet Take one tablet by mouth once daily (Patient taking differently: Take one tablet by mouth once daily) 90 tablet 3  . metFORMIN (GLUCOPHAGE) 500 MG tablet Take 1 tablet (500 mg total) by mouth daily with breakfast. 90 tablet 3  . metoprolol succinate (TOPROL-XL) 25 MG 24 hr tablet Take 1 tablet (25 mg total) by mouth daily. Take with or immediately following a meal. 90 tablet 1  . nicotine (NICODERM CQ - DOSED IN MG/24 HOURS) 21 mg/24hr patch Place 1 patch (21 mg total) onto  the skin daily. 30 patch 1  . tobramycin (TOBREX) 0.3 % ophthalmic solution Place 2 drops into the left eye every 6 (six) hours. 5 mL 0  . buPROPion (WELLBUTRIN XL) 150 MG 24 hr tablet Take one tablet by mouth once daily. (Patient not taking: Reported on 08/02/2020) 90 tablet 0   No facility-administered medications prior to visit.         Objective:   Physical Exam Vitals:   08/02/20 1118  BP: 110/60  Pulse: (!) 57  Temp: 98 F (36.7 C)  TempSrc: Temporal  SpO2: 97%  Weight: 169 lb (76.7 kg)  Height: 5\' 7"  (1.702 m)   Gen: Pleasant, well-nourished, in no distress,  normal affect  ENT: No lesions,  mouth clear,  oropharynx clear, no postnasal drip  Neck: No JVD, no stridor  Lungs: No use of  accessory muscles, no crackles or wheezing on normal respiration, no wheeze on forced expiration  Cardiovascular: RRR, heart sounds normal, no murmur or gallops, no peripheral edema  Musculoskeletal: No deformities, no cyanosis or clubbing  Neuro: alert, awake, non focal  Skin: Warm, no lesions or rash      Assessment & Plan:  ILD (interstitial lung disease) (HCC) Broad differential diagnosis.  Consider RB-ILD and patient with significant tobacco history.  No clear history of autoimmune disease but this is a possibility as well. Plan to obtain a high-resolution CT scan of the chest without contrast now Send autoimmune labs, QuantiFERON gold, hypersensitivity pneumonitis panel, ACE level  Cigarette nicotine dependence without complication With probable COPD.  Plan for pulmonary function testing, bronchodilator therapy depending on results and symptoms.  He quit smoking 3 days ago, encouraged him in his efforts at cessation.  Pulmonary nodules Noted on his lung cancer screening CT.  He will be due for repeat lung cancer screening scan in September 2022.  We will arrange.  October 2022, MD, PhD 08/02/2020, 11:51 AM [facility] [phone] or if no answer before 7:00PM call [phone] For any issues after 7:00PM please call eLink [phone]

## 2020-08-02 NOTE — Assessment & Plan Note (Signed)
With probable COPD.  Plan for pulmonary function testing, bronchodilator therapy depending on results and symptoms.  He quit smoking 3 days ago, encouraged him in his efforts at cessation.

## 2020-08-06 LAB — SJOGREN'S SYNDROME ANTIBODS(SSA + SSB)
SSA (Ro) (ENA) Antibody, IgG: <1 AI
SSB (La) (ENA) Antibody, IgG: <1 AI

## 2020-08-06 LAB — ANCA SCREEN W REFLEX TITER: ANCA Screen: NEGATIVE

## 2020-08-06 LAB — ANA,IFA RA DIAG PNL W/RFLX TIT/PATN
Anti Nuclear Antibody (ANA): NEGATIVE
Cyclic Citrullin Peptide Ab: <16 UNITS
Rheumatoid fact SerPl-aCnc: <14 IU/mL (ref <14)

## 2020-08-06 LAB — ANTI-SCLERODERMA ANTIBODY: Scleroderma (Scl-70) (ENA) Antibody, IgG: <1 AI

## 2020-08-06 LAB — QUANTIFERON-TB GOLD PLUS

## 2020-08-06 LAB — ANGIOTENSIN CONVERTING ENZYME: Angiotensin-Converting Enzyme: 25 U/L (ref 9–67)

## 2020-08-07 ENCOUNTER — Ambulatory Visit (HOSPITAL_BASED_OUTPATIENT_CLINIC_OR_DEPARTMENT_OTHER): Payer: Medicare Other

## 2020-08-07 LAB — HYPERSENSITIVITY PNEUMONITIS
A. Pullulans Abs: NEGATIVE
A.Fumigatus #1 Abs: NEGATIVE
Micropolyspora faeni, IgG: NEGATIVE
Pigeon Serum Abs: NEGATIVE
Thermoact. Saccharii: NEGATIVE
Thermoactinomyces vulgaris, IgG: NEGATIVE

## 2020-08-08 ENCOUNTER — Other Ambulatory Visit (HOSPITAL_BASED_OUTPATIENT_CLINIC_OR_DEPARTMENT_OTHER): Payer: Self-pay

## 2020-08-08 ENCOUNTER — Other Ambulatory Visit: Payer: Self-pay

## 2020-08-08 ENCOUNTER — Ambulatory Visit (HOSPITAL_BASED_OUTPATIENT_CLINIC_OR_DEPARTMENT_OTHER)
Admission: RE | Admit: 2020-08-08 | Discharge: 2020-08-08 | Disposition: A | Discharge status: Home / Self Care | Payer: Medicare Other | Source: Ambulatory Visit | Attending: Emergency Medicine | Admitting: Emergency Medicine

## 2020-08-08 DIAGNOSIS — J849 Interstitial pulmonary disease, unspecified: Secondary | ICD-10-CM | POA: Insufficient documentation

## 2020-08-18 LAB — MYOMARKER 3 PLUS PROFILE (RDL)

## 2020-08-22 ENCOUNTER — Other Ambulatory Visit (HOSPITAL_BASED_OUTPATIENT_CLINIC_OR_DEPARTMENT_OTHER): Payer: Self-pay

## 2020-08-23 ENCOUNTER — Other Ambulatory Visit (HOSPITAL_BASED_OUTPATIENT_CLINIC_OR_DEPARTMENT_OTHER): Payer: Self-pay

## 2020-08-23 ENCOUNTER — Ambulatory Visit (INDEPENDENT_AMBULATORY_CARE_PROVIDER_SITE_OTHER): Payer: Medicare Other | Admitting: Gastroenterology

## 2020-08-23 ENCOUNTER — Encounter: Payer: Self-pay | Admitting: Gastroenterology

## 2020-08-23 ENCOUNTER — Telehealth: Payer: Self-pay

## 2020-08-23 ENCOUNTER — Other Ambulatory Visit: Payer: Self-pay

## 2020-08-23 VITALS — BP 118/62 | HR 58 | Ht 66.93 in | Wt 170.1 lb

## 2020-08-23 DIAGNOSIS — K219 Gastro-esophageal reflux disease without esophagitis: Secondary | ICD-10-CM | POA: Diagnosis not present

## 2020-08-23 DIAGNOSIS — Z1211 Encounter for screening for malignant neoplasm of colon: Secondary | ICD-10-CM

## 2020-08-23 MED ORDER — CLENPIQ 10-3.5-12 MG-GM -GM/160ML PO SOLN
1.0000 | Freq: Once | ORAL | 0 refills | Status: AC
  Filled 2020-08-23: qty 320, 30d supply, fill #0

## 2020-08-23 MED ORDER — OMEPRAZOLE 20 MG PO CPDR
20.0000 mg | DELAYED_RELEASE_CAPSULE | Freq: Every day | ORAL | 11 refills | Status: DC
  Filled 2020-08-23: qty 30, 30d supply, fill #0

## 2020-08-23 NOTE — Progress Notes (Signed)
Chief Complaint: For GI evaluation  Referring Provider:  Esperanza Richters, PA-C      ASSESSMENT AND PLAN;   #1. GERD  #2. Screening  Plan: - Proceed with EGD/colon.  - Omeprazole 20mg  po qd thereafter after   I discussed the nature of the recommended EGD/Colonoscopy , as well as the indications, risks, alternatives and potential complications including, but not limited to, bleeding, infection, reaction to medication, damage to internal organs, cardiac and/or pulmonary problems, and perforation requiring surgery (1 to 2 in 1000). The possibility that significant findings could be missed was explained. All ? were answered. The patient gives consent for the procedures.     Addendum: We were able to obtain more records.  Patient had left the clinic by then. He actually had colonoscopy June 04, 2017 by Dr. June 06, 2017 which showed small internal hemorrhoids. It was otherwise unremarkable.  The preparation was good.  He recommended to repeat in 10 yrs.  Hence, we would just proceed with EGD and hold off on colon.  Certainly, if he starts having any rectal bleeding, we would change the plan.  Report has been sent for scanning.   HPI:    Peter Wong is a 70 y.o. male, 75 who does not speak much English accompained with church member With ILD, CAD s/p CABG (Nl EF 10/2019), HLD, HTN, DM  Sent to GI clinic for EGD/colon.  Has been having occasional heartburn and was quite worried about H. Pylori.  He would occasionally have postprandial abdominal discomfort which gets better on its own.  No family history of stomach cancers.  Had previous colon ?2017 or earlier at [facility]- neg. Per friend, he may be due for repeat screening colonoscopy.  We could not find any records in Care Everywhere.  No significant diarrhea or constipation.  No melena or hematochezia. No unintentional weight loss. No abdominal pain.   Past Medical History:  Diagnosis Date  . Diabetes mellitus without  complication (HCC)   . Hyperlipidemia   . Hypertension     Past Surgical History:  Procedure Laterality Date  . COLONOSCOPY     [facility] [place] around 2017  . CORONARY ARTERY BYPASS GRAFT  10/2014    Family History  Problem Relation Age of Onset  . Lung cancer Mother   . Colon cancer Neg Hx   . Esophageal cancer Neg Hx     Social History   Tobacco Use  . Smoking status: Former Smoker    Packs/day: 1.00    Years: 40.00    Pack years: 40.00    Types: Cigarettes    Quit date: 04/29/2016    Years since quitting: 4.3  . Smokeless tobacco: Never Used  . Tobacco comment: quit 3 days ago 08/02/20 ARJ   Vaping Use  . Vaping Use: Never used  Substance Use Topics  . Alcohol use: Not Currently  . Drug use: Never    Current Outpatient Medications  Medication Sig Dispense Refill  . aspirin 81 MG chewable tablet Chew 1 tablet by mouth daily.    08/04/20 atorvastatin (LIPITOR) 80 MG tablet Take 1 tablet (80 mg total) by mouth daily. 90 tablet 1  . buPROPion (WELLBUTRIN XL) 150 MG 24 hr tablet Take one tablet by mouth once daily. (Patient taking differently: Take one tablet by mouth once daily.) 90 tablet 0  . metFORMIN (GLUCOPHAGE) 500 MG tablet Take 1 tablet (500 mg total) by mouth daily with breakfast. 90 tablet 3  . metoprolol succinate (  TOPROL-XL) 25 MG 24 hr tablet Take 1 tablet (25 mg total) by mouth daily. Take with or immediately following a meal. 90 tablet 1  . nicotine (NICODERM CQ - DOSED IN MG/24 HOURS) 21 mg/24hr patch Place 1 patch (21 mg total) onto the skin daily. 30 patch 1  . guaifenesin (HUMIBID E) 400 MG TABS tablet Take one tablet by mouth once daily (Patient not taking: Reported on 08/23/2020) 90 tablet 3   No current facility-administered medications for this visit.    No Known Allergies  Review of Systems:  Constitutional: Denies fever, chills, diaphoresis, appetite change and fatigue.  HEENT: Denies photophobia, eye pain, redness, hearing loss, ear pain,  congestion, sore throat, rhinorrhea, sneezing, mouth sores, neck pain, neck stiffness and tinnitus.   Respiratory: Denies SOB, DOE, cough, chest tightness,  and wheezing.   Cardiovascular: Denies chest pain, palpitations and leg swelling.  Genitourinary: Denies dysuria, urgency, frequency, hematuria, flank pain and difficulty urinating.  Musculoskeletal: Denies myalgias, back pain, joint swelling, arthralgias and gait problem.  Skin: No rash.  Neurological: Denies dizziness, seizures, syncope, weakness, light-headedness, numbness and headaches.  Hematological: Denies adenopathy. Easy bruising, personal or family bleeding history  Psychiatric/Behavioral: No anxiety or depression     Physical Exam:    BP 118/62   Pulse (!) 58   Ht 5' 6.93" (1.7 m)   Wt 170 lb 2 oz (77.2 kg)   BMI 26.70 kg/m  Wt Readings from Last 3 Encounters:  08/23/20 170 lb 2 oz (77.2 kg)  08/02/20 169 lb (76.7 kg)  07/26/20 171 lb (77.6 kg)   Constitutional:  Well-developed, in no acute distress. Psychiatric: Normal mood and affect. Behavior is normal. HEENT: Pupils normal.  Conjunctivae are normal. No scleral icterus. Cardiovascular: Normal rate, regular rhythm. No edema Pulmonary/chest: Effort normal and breath sounds normal. No wheezing.  Bilateral fine Rales/rub. Abdominal: Soft, nondistended. Nontender. Bowel sounds active throughout. There are no masses palpable. No hepatomegaly. Rectal: Deferred Neurological: Alert and oriented to person place and time. Skin: Skin is warm and dry. No rashes noted.  Data Reviewed: I have personally reviewed following labs and imaging studies  CBC: CBC Latest Ref Rng & Units 06/04/2018 08/24/2010 08/23/2010  WBC 4.0 - 10.5 K/uL 12.4(H) 14.2(H) 14.3(H)  Hemoglobin 13.0 - 17.0 g/dL 69.6 78.9 38.1  Hematocrit 39.0 - 52.0 % 42.2 40.3 45.1  Platelets 150 - 400 K/uL 145(L) 192 194    CMP: CMP Latest Ref Rng & Units 06/27/2020 06/04/2018 08/24/2010  Glucose 70 - 99 mg/dL  017(P) 102(H) 852(D)  BUN 6 - 23 mg/dL 14 15 15   Creatinine 0.40 - 1.50 mg/dL 7.82 4.23  Sodium 135 - 145 mEq/L 139 129(L) 135  Potassium 3.5 - 5.1 mEq/L 4.5 3.2(L) 3.7  Chloride 96 - 112 mEq/L 105 99 103  CO2 19 - 32 mEq/L 28 20(L) 21  Calcium 8.4 - 10.5 mg/dL 9.5 7.7(L) 8.4  Total Protein 6.0 - 8.3 g/dL 7.2 6.3(L) 6.7  Total Bilirubin 0.2 - 1.2 mg/dL 0.7 5.36) 0.3  Alkaline Phos 39 - 117 U/L 103 72 83  AST 0 - 37 U/L 22 25 22   ALT 0 - 53 U/L 32 32 38     Radiology Studies: CT Chest High Resolution  Result Date: 08/08/2020 CLINICAL DATA:  70 year old male with history of interstitial lung disease. EXAM: CT CHEST WITHOUT CONTRAST TECHNIQUE: Multidetector CT imaging of the chest was performed following the standard protocol without intravenous contrast. High resolution imaging of the lungs, as  well as inspiratory and expiratory imaging, was performed. COMPARISON:  Chest CT 07/05/2020. FINDINGS: Cardiovascular: Heart size is normal. There is no significant pericardial fluid, thickening or pericardial calcification. There is aortic atherosclerosis, as well as atherosclerosis of the great vessels of the mediastinum and the coronary arteries, including calcified atherosclerotic plaque in the left main, left anterior descending, left circumflex and right coronary arteries. Calcifications of the aortic valve. Status post median sternotomy for CABG. Mediastinum/Nodes: No pathologically enlarged mediastinal or hilar lymph nodes. Please note that accurate exclusion of hilar adenopathy is limited on noncontrast CT scans. Esophagus is unremarkable in appearance. No axillary lymphadenopathy. Lungs/Pleura: Widespread patchy areas of ground-glass attenuation, septal thickening and thickening of the peribronchovascular interstitium are noted throughout the lungs bilaterally. No traction bronchiectasis or honeycombing. Findings have a definitive craniocaudal gradient. Inspiratory and expiratory imaging is  unremarkable. No acute consolidative airspace disease. No pleural effusions. Small pulmonary nodules are noted, largest of which is in the right middle lobe associated with the minor fissure (axial image 69 of series 5) measuring 6 x 4 mm (mean diameter 5 mm). Upper Abdomen: Unremarkable. Musculoskeletal: Median sternotomy wires. There are no aggressive appearing lytic or blastic lesions noted in the visualized portions of the skeleton. IMPRESSION: 1. The appearance of the lungs is compatible with interstitial lung disease, with a spectrum of findings considered indeterminate for usual interstitial pneumonia (UIP) per current ATS guidelines. 2. Aortic atherosclerosis, in addition to left main and 3 vessel coronary artery disease. Status post median sternotomy for CABG. 3. There are calcifications of the aortic valve. Echocardiographic correlation for evaluation of potential valvular dysfunction may be warranted if clinically indicated. Aortic Atherosclerosis (ICD10-I70.0). Electronically Signed   By: Trudie Reed M.D.   On: 08/08/2020 15:23      Edman Circle, MD 08/23/2020, 9:07 AM  Cc: Esperanza Richters, PA-C

## 2020-08-23 NOTE — Patient Instructions (Addendum)
If you are age 68 or older, your body mass index should be between 23-30. Your Body mass index is 26.7 kg/m. If this is out of the aforementioned range listed, please consider follow up with your Primary Care Provider.  If you are age 27 or younger, your body mass index should be between 19-25. Your Body mass index is 26.7 kg/m. If this is out of the aformentioned range listed, please consider follow up with your Primary Care Provider.   It has been recommended to you by your physician that you have a(n) EGD/Colon completed. Per your request, we did not schedule the procedure(s) today. Please contact our office at [phone] should you decide to have the procedure completed. You will be scheduled for a pre-visit and procedure at that time. You have been give your instructions   We have sent the following medications to your pharmacy for you to pick up at your convenience: Omeprazole Clenpiq Please call with any questions or concerns.  Thank you,  Dr. Lynann Bologna

## 2020-08-23 NOTE — Telephone Encounter (Signed)
LVM on Peter Wong's number to call back  When it is time for patient to schedule his procedure he can schedule the EGD only. The colonoscopy is only needed if he is having problems such as rectal bleeding. Last colon was 2019 and it was normal so he can repeat in 10 years unless there is issues

## 2020-08-28 ENCOUNTER — Other Ambulatory Visit (HOSPITAL_BASED_OUTPATIENT_CLINIC_OR_DEPARTMENT_OTHER): Payer: Self-pay

## 2020-08-30 ENCOUNTER — Encounter: Payer: Self-pay | Admitting: Gastroenterology

## 2020-09-05 ENCOUNTER — Other Ambulatory Visit (HOSPITAL_COMMUNITY)
Admission: RE | Admit: 2020-09-05 | Discharge: 2020-09-05 | Disposition: A | Discharge status: Home / Self Care | Payer: Medicare Other | Source: Ambulatory Visit | Attending: Emergency Medicine | Admitting: Emergency Medicine

## 2020-09-05 DIAGNOSIS — Z01812 Encounter for preprocedural laboratory examination: Secondary | ICD-10-CM | POA: Diagnosis present

## 2020-09-05 DIAGNOSIS — Z20822 Contact with and (suspected) exposure to covid-19: Secondary | ICD-10-CM | POA: Insufficient documentation

## 2020-09-05 LAB — SARS CORONAVIRUS 2 (TAT 6-24 HRS): SARS Coronavirus 2: NEGATIVE

## 2020-09-07 ENCOUNTER — Ambulatory Visit: Payer: Medicare Other | Admitting: Emergency Medicine

## 2020-09-07 ENCOUNTER — Other Ambulatory Visit (HOSPITAL_BASED_OUTPATIENT_CLINIC_OR_DEPARTMENT_OTHER): Payer: Self-pay

## 2020-09-07 ENCOUNTER — Other Ambulatory Visit: Payer: Self-pay

## 2020-09-07 ENCOUNTER — Encounter: Payer: Self-pay | Admitting: Emergency Medicine

## 2020-09-07 ENCOUNTER — Ambulatory Visit (INDEPENDENT_AMBULATORY_CARE_PROVIDER_SITE_OTHER): Payer: Medicare Other | Admitting: Emergency Medicine

## 2020-09-07 DIAGNOSIS — J849 Interstitial pulmonary disease, unspecified: Secondary | ICD-10-CM

## 2020-09-07 DIAGNOSIS — R918 Other nonspecific abnormal finding of lung field: Secondary | ICD-10-CM

## 2020-09-07 DIAGNOSIS — F17210 Nicotine dependence, cigarettes, uncomplicated: Secondary | ICD-10-CM | POA: Diagnosis not present

## 2020-09-07 LAB — PULMONARY FUNCTION TEST
DL/VA % pred: 101 %
DL/VA: 4.44 ml/min/mmHg/L
DLCO cor % pred: 67 %
DLCO cor: 18.73 ml/min/mmHg
DLCO unc % pred: 67 %
DLCO unc: 18.73 ml/min/mmHg
FEF 25-75 Post: 5.82 L/sec
FEF 25-75 Pre: 6.04 L/sec
FEF2575-%Change-Post: -3 %
FEF2575-%Pred-Post: 261 %
FEF2575-%Pred-Pre: 271 %
FEV1-%Change-Post: 0 %
FEV1-%Pred-Post: 100 %
FEV1-%Pred-Pre: 99 %
FEV1-Post: 2.75 L
FEV1-Pre: 2.75 L
FEV1FVC-%Change-Post: 0 %
FEV1FVC-%Pred-Pre: 119 %
FEV6-%Change-Post: 1 %
FEV6-Post: 2.99 L
FEV6-Pre: 2.95 L
FVC-%Change-Post: 0 %
FVC-%Pred-Post: 84 %
FVC-%Pred-Pre: 83 %
FVC-Post: 2.99 L
FVC-Pre: 2.97 L
Post FEV1/FVC ratio: 92 %
Post FEV6/FVC ratio: 100 %
Pre FEV1/FVC ratio: 93 %
Pre FEV6/FVC Ratio: 100 %
RV % pred: 57 %
RV: 1.28 L
TLC % pred: 70 %
TLC: 4.47 L

## 2020-09-07 MED ORDER — NICOTINE 21 MG/24HR TD PT24
21.0000 mg | MEDICATED_PATCH | Freq: Every day | TRANSDERMAL | 1 refills | Status: DC
  Filled 2020-09-07: qty 28, 28d supply, fill #0

## 2020-09-07 MED ORDER — BUPROPION HCL ER (XL) 150 MG PO TB24
ORAL_TABLET | ORAL | 0 refills | Status: DC
  Filled 2020-09-07: qty 90, fill #0

## 2020-09-07 NOTE — Assessment & Plan Note (Signed)
Noted on his CT chest in still present on the high-resolution scan.  Largest is 6 mm in the right middle lobe.  Given his tobacco history he needs interval follow-up, next in 6 months.

## 2020-09-07 NOTE — Assessment & Plan Note (Addendum)
Benefiting from Wellbutrin, working on stopping altogether.  Is also using nicotine patches.  Plan to refill these.  Encouraged him to stop smoking completely.  His pulmonary function testing did not show any evidence for obstruction.  He is asymptomatic so I will not start bronchodilator therapy at this time.

## 2020-09-07 NOTE — Progress Notes (Signed)
Full PFT performed today. °

## 2020-09-07 NOTE — Progress Notes (Signed)
Subjective:    Patient ID: Peter Wong, male    DOB: 10-15-1950, 70 y.o.   MRN: [id number]  HPI 70 year old smoker (40 pack years) with CAD/CABG, hypertension, diabetes, hyperlipidemia.  He has a family history of lung cancer in his mother.  He is referred today for abnormal lung cancer screening CT scan that was done on 07/05/2020.  He is working on smoking cessation, just started Wellbutrin. Also has patches. Denies any dyspnea, cough. Has a friend here with him who translates from [place]  CT chest 07/05/2020 reviewed by me, shows no mediastinal or hilar lymphadenopathy, mild centrilobular emphysema and diffuse bronchial wall thickening, extensive patchy groundglass and scattered septal thickening in both lungs most prominent in the lower lobes with some associated traction bronchiolectasis, a few scattered solid pulmonary nodules largest 7 mm in the right middle lobe   ROV 09/07/20 --follow-up visit for 70 year old gentleman history of tobacco use,, CAD/CABG, hypertension, diabetes, hyperlipidemia.  He was found to have emphysematous change and diffuse bronchial wall thickening with some extensive patchy groundglass changes on screening CT scan of the chest 07/05/2020.  Also noted with some traction bronchiolectasis and a few scattered solid pulmonary nodules.  He is not on any bronchodilator therapy. Has smoked only 2 cigarettes since last visit. Needs patches and wellbutrin. Speaks via Nurse, learning disability. He is not having any dyspnea, cough or wheezing. Some decreased appetite.   Labs 08/02/2020 reviewed, ANCA, ACE level, anti-SCL, hypersensitivity pneumonitis panel, SSA and SSB, ANA, RF, CCP, and all other autoimmune labs from the biomarker panel are negative.  The QuantiFERON gold was not done.  High-resolution CT scan of the chest performed 08/08/2020 reviewed by me, shows interstitial changes that are indeterminate for UIP pattern with some widespread patchy areas of groundglass attenuation and septal  thickening peribronchovascular prominence without traction bronchiectasis or honeycomb change.  Largest pulmonary nodule 6 mm in the right middle lobe.   PFT performed today reviewed by me, shows spirometry consistent with possible restriction, restricted lung volumes and a decreased diffusion capacity   Review of Systems As per HPI      Objective:   Physical Exam Vitals:   09/07/20 1157  BP: 130/68  Pulse: 62  Temp: 98.2 F (36.8 C)  TempSrc: Temporal  SpO2: 99%  Weight: 166 lb (75.3 kg)  Height: 5' 6.7" (1.694 m)   Gen: Pleasant, well-nourished, in no distress,  normal affect  ENT: No lesions,  mouth clear,  oropharynx clear, no postnasal drip  Neck: No JVD, no stridor  Lungs: No use of accessory muscles, few insp basilar crackles.   Cardiovascular: RRR, heart sounds normal, no murmur or gallops, no peripheral edema  Musculoskeletal: No deformities, no cyanosis or clubbing  Neuro: alert, awake, non focal  Skin: Warm, no lesions or rash      Assessment & Plan:  ILD (interstitial lung disease) (HCC) No honeycomb change but he does have confirmed interstitial disease with patchy groundglass on his high-res CT.  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  He is asymptomatic.  I think we can plan to repeat his CT chest in 6 months, look for either resolution (off cigarettes) or progression/stability.  If no improvement then I will refer him for evaluation in our ILD clinic.  Pulmonary nodules Noted on his CT chest in still present on the high-resolution scan.  Largest is 6 mm in the right middle lobe.  Given his tobacco history he needs interval follow-up, next in 6 months.  Cigarette nicotine dependence without  complication Benefiting from Wellbutrin, working on stopping altogether.  Is also using nicotine patches.  Plan to refill these.  Encouraged him to stop smoking completely.  His pulmonary function testing did not show any evidence for obstruction.   He is asymptomatic so I will not start bronchodilator therapy at this time.  Levy Pupa, MD, PhD 09/07/2020, 12:35 PM  [facility] [phone] or if no answer before 7:00PM call [phone] For any issues after 7:00PM please call eLink [phone]

## 2020-09-07 NOTE — Patient Instructions (Signed)
Full PFT performed today. °

## 2020-09-07 NOTE — Patient Instructions (Signed)
Congratulations on decreasing your smoking.  Continue to work hard on stopping altogether. We will continue your Wellbutrin and your nicotine patches.  We will send new prescriptions for these today We will not start any inhaler medication at this time. We will plan to repeat your CT scan of the chest in November 2022. Follow Dr. Delton Coombes in November after your CT so that we can review the results together.

## 2020-09-07 NOTE — Assessment & Plan Note (Signed)
No honeycomb change but he does have confirmed interstitial disease with patchy groundglass on his high-res CT.  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  He is asymptomatic.  I think we can plan to repeat his CT chest in 6 months, look for either resolution (off cigarettes) or progression/stability.  If no improvement then I will refer him for evaluation in our ILD clinic.

## 2020-09-26 ENCOUNTER — Ambulatory Visit (INDEPENDENT_AMBULATORY_CARE_PROVIDER_SITE_OTHER): Payer: Medicare Other | Admitting: Medical

## 2020-09-26 ENCOUNTER — Other Ambulatory Visit: Payer: Self-pay

## 2020-09-26 VITALS — BP 114/80 | HR 69 | Resp 18 | Ht 66.0 in | Wt 167.0 lb

## 2020-09-26 DIAGNOSIS — F17200 Nicotine dependence, unspecified, uncomplicated: Secondary | ICD-10-CM | POA: Diagnosis not present

## 2020-09-26 DIAGNOSIS — I2510 Atherosclerotic heart disease of native coronary artery without angina pectoris: Secondary | ICD-10-CM | POA: Diagnosis not present

## 2020-09-26 DIAGNOSIS — E1169 Type 2 diabetes mellitus with other specified complication: Secondary | ICD-10-CM | POA: Diagnosis not present

## 2020-09-26 DIAGNOSIS — R350 Frequency of micturition: Secondary | ICD-10-CM | POA: Diagnosis not present

## 2020-09-26 DIAGNOSIS — I10 Essential (primary) hypertension: Secondary | ICD-10-CM | POA: Diagnosis not present

## 2020-09-26 DIAGNOSIS — R5383 Other fatigue: Secondary | ICD-10-CM | POA: Diagnosis not present

## 2020-09-26 DIAGNOSIS — E785 Hyperlipidemia, unspecified: Secondary | ICD-10-CM

## 2020-09-26 LAB — CBC WITH DIFFERENTIAL/PLATELET
Basophils Absolute: 0 10*3/uL (ref 0.0–0.1)
Basophils Relative: 0.9 % (ref 0.0–3.0)
Eosinophils Absolute: 0.4 10*3/uL (ref 0.0–0.7)
Eosinophils Relative: 10.1 % — ABNORMAL HIGH (ref 0.0–5.0)
HCT: 41.4 % (ref 39.0–52.0)
Hemoglobin: 14.2 g/dL (ref 13.0–17.0)
Lymphocytes Relative: 25.6 % (ref 12.0–46.0)
Lymphs Abs: 1 10*3/uL (ref 0.7–4.0)
MCHC: 34.2 g/dL (ref 30.0–36.0)
MCV: 92.6 fl (ref 78.0–100.0)
Monocytes Absolute: 0.4 10*3/uL (ref 0.1–1.0)
Monocytes Relative: 9.9 % (ref 3.0–12.0)
Neutro Abs: 2.1 10*3/uL (ref 1.4–7.7)
Neutrophils Relative %: 53.5 % (ref 43.0–77.0)
Platelets: 190 10*3/uL (ref 150.0–400.0)
RBC: 4.47 Mil/uL (ref 4.22–5.81)
RDW: 14 % (ref 11.5–15.5)
WBC: 3.9 10*3/uL — ABNORMAL LOW (ref 4.0–10.5)

## 2020-09-26 LAB — COMPREHENSIVE METABOLIC PANEL
ALT: 31 U/L (ref 0–53)
AST: 21 U/L (ref 0–37)
Albumin: 4.2 g/dL (ref 3.5–5.2)
Alkaline Phosphatase: 82 U/L (ref 39–117)
BUN: 14 mg/dL (ref 6–23)
CO2: 31 mEq/L (ref 19–32)
Calcium: 9.2 mg/dL (ref 8.4–10.5)
Chloride: 103 mEq/L (ref 96–112)
Creatinine, Ser: 0.86 mg/dL (ref 0.40–1.50)
GFR: 89.06 mL/min (ref >60.00)
Glucose, Bld: 144 mg/dL — ABNORMAL HIGH (ref 70–99)
Potassium: 4 mEq/L (ref 3.5–5.1)
Sodium: 138 mEq/L (ref 135–145)
Total Bilirubin: 0.7 mg/dL (ref 0.2–1.2)
Total Protein: 6.7 g/dL (ref 6.0–8.3)

## 2020-09-26 LAB — PSA: PSA: 0.5 ng/mL (ref 0.10–4.00)

## 2020-09-26 LAB — T4, FREE: Free T4: 0.9 ng/dL (ref 0.60–1.60)

## 2020-09-26 LAB — VITAMIN B12: Vitamin B-12: 814 pg/mL (ref 211–911)

## 2020-09-26 LAB — TSH: TSH: 0.78 u[IU]/mL (ref 0.35–4.50)

## 2020-09-26 NOTE — Progress Notes (Signed)
Subjective:    Patient ID: Peter Wong, male    DOB: 1950-06-28, 70 y.o.   MRN: [id number]  HPI  High cholesterol continue atorvastatin.   Htn metoprolol 25 mg daily. Bp good/controlled  Today.   For drooped eye lids referred to eye MD. Pt has not got called though I did put in the referral 2 months ago.   For history of smoking advised nicotine patch. Can start  wellbutrin rx advisement. Explained potential metoprolol interaction. Since last visit   Smoking and pulonornary nodules as well as possible interstitial lung disease follow up with pulmonolgist in May. Pt did see pulmonologist. Plan below in "    "ILD (interstitial lung disease) (HCC) No honeycomb change but he does have confirmed interstitial disease with patchy groundglass on his high-res CT.  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  He is asymptomatic.  I think we can plan to repeat his CT chest in 6 months, look for either resolution (off cigarettes) or progression/stability.  If no improvement then I will refer him for evaluation in our ILD clinic.   Pulmonary nodules Noted on his CT chest in still present on the high-resolution scan.  Largest is 6 mm in the right middle lobe.  Given his tobacco history he needs interval follow-up, next in 6 months.   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  Is also using nicotine patches.  Plan to refill these.  Encouraged him to stop smoking completely.  His pulmonary function testing did not show any evidence for obstruction.  He is asymptomatic so I will not start bronchodilator therapy at this time."    Last visit for elevated blood sugar advised  continue metformin and low sugar diet.  For gerd referred to Dr. Chales Abrahams. Will get egd. Colonoscopy up to date.    Review of Systems  Constitutional:  Positive for fatigue. Negative for chills and fever.       10 pound approximate weight loss past year.  Normal appetitie but gets full easily.  HENT:  Negative for congestion, ear pain, hearing loss, mouth sores, sinus pressure and sinus pain.   Respiratory:  Negative for cough, chest tightness, shortness of breath and wheezing.   Cardiovascular:  Negative for chest pain and palpitations.  Gastrointestinal:  Negative for abdominal pain, blood in stool, diarrhea and vomiting.  Genitourinary:  Negative for dysuria, flank pain, frequency, genital sores and hematuria.  Musculoskeletal:  Negative for back pain.  Skin:  Negative for rash.  Neurological:  Negative for dizziness, seizures, speech difficulty, numbness and headaches.       No falls.  Psychiatric/Behavioral:  Negative for behavioral problems, decreased concentration, dysphoric mood and suicidal ideas. The patient is not nervous/anxious.     Past Medical History:  Diagnosis Date   Diabetes mellitus without complication (HCC)    Hyperlipidemia    Hypertension      Social History   Socioeconomic History   Marital status: Married    Spouse name: Not on file   Number of children: Not on file   Years of education: Not on file   Highest education level: Not on file  Occupational History   Not on file  Tobacco Use   Smoking status: Former    Packs/day: 1.00    Years: 40.00    Pack years: 40.00    Types: Cigarettes    Quit date: 04/29/2016    Years since quitting: 4.4   Smokeless tobacco: Never  Tobacco comments:    pt smoked 3 cig since last visit [facility] 09/07/20  Vaping Use   Vaping Use: Never used  Substance and Sexual Activity   Alcohol use: Not Currently   Drug use: Never   Sexual activity: Not Currently  Other Topics Concern   Not on file  Social History Narrative   Not on file   Social Determinants of Health   Financial Resource Strain: Not on file  Food Insecurity: Not on file  Transportation Needs: Not on file  Physical Activity: Not on file  Stress: Not on file  Social Connections: Not on file  Intimate  Partner Violence: Not on file    Past Surgical History:  Procedure Laterality Date   COLONOSCOPY  06/04/2017    [facility]. Normal mucous in the whole colon.Internal hemorrhoids. Colonoscopy was otherwise normal   CORONARY ARTERY BYPASS GRAFT  10/2014    Family History  Problem Relation Age of Onset   Lung cancer Mother    Colon cancer Neg Hx    Esophageal cancer Neg Hx     No Known Allergies  Current Outpatient Medications on File Prior to Visit  Medication Sig Dispense Refill   aspirin 81 MG chewable tablet Chew 1 tablet by mouth daily.     atorvastatin (LIPITOR) 80 MG tablet Take 1 tablet (80 mg total) by mouth daily. 90 tablet 1   buPROPion (WELLBUTRIN XL) 150 MG 24 hr tablet Take one tablet by mouth once daily. 90 tablet 0   metFORMIN (GLUCOPHAGE) 500 MG tablet Take 1 tablet (500 mg total) by mouth daily with breakfast. 90 tablet 3   metoprolol succinate (TOPROL-XL) 25 MG 24 hr tablet Take 1 tablet (25 mg total) by mouth daily. Take with or immediately following a meal. 90 tablet 1   nicotine (NICODERM CQ - DOSED IN MG/24 HOURS) 21 mg/24hr patch Place 1 patch (21 mg total) onto the skin daily. 28 patch 1   No current facility-administered medications on file prior to visit.    BP 114/80   Pulse 69   Resp 18   Ht 5\' 6"  (1.676 m)   Wt 167 lb (75.8 kg)   SpO2 98%   BMI 26.95 kg/m       Objective:   Physical Exam   General Mental Status- Alert. General Appearance- Not in acute distress.   Skin General: Color- Normal Color. Moisture- Normal Moisture.  Neck Carotid Arteries- Normal color. Moisture- Normal Moisture. No carotid bruits. No JVD.  Chest and Lung Exam Auscultation: Breath Sounds:-Normal.  Cardiovascular Auscultation:Rythm- Regular. Murmurs & Other Heart Sounds:Auscultation of the heart reveals- No Murmurs.  Abdomen Inspection:-Inspeection Normal. Palpation/Percussion:Note:No mass. Palpation and Percussion of the abdomen reveal-  Non Tender, Non Distended + BS, no rebound or guarding.   Neurologic Cranial Nerve exam:- CN III-XII intact(No nystagmus), symmetric smile. Strength:- 5/5 equal and symmetric strength both upper and lower extremities.      Assessment & Plan:   Htn continue metoprolol  For droopped eye lid sent message to staff about referral to eye MD.  Good job on quitting smoking. Continue wellbutrin and nicotine patches.  For ILD and nodule continue to follow up with pulmonologist.  For high cholesterol continue lipitor.  For diabetes continue metformin and low sugar diet.  For hx of gerd follow thru with egd.  For fatigue will get below listed labs.  Follow up 3 months or as needed.

## 2020-09-26 NOTE — Patient Instructions (Addendum)
Htn continue metoprolol  For droopped eye lid sent message to staff about referral to eye MD.  Good job on quitting smoking. Continue wellbutrin and nicotine patches.  For ILD and nodule continue to follow up with pulmonologist.  For high cholesterol continue lipitor.  For diabetes continue metformin and low sugar diet.  For hx of gerd follow thru with egd.  For fatigue will get below listed labs.  Follow up 3 months or as needed.

## 2020-09-29 LAB — VITAMIN B1: Vitamin B1 (Thiamine): 29 nmol/L (ref 8–30)

## 2020-09-29 LAB — VITAMIN D 1,25 DIHYDROXY
Vitamin D 1, 25 (OH)2 Total: 25 pg/mL (ref 18–72)
Vitamin D2 1, 25 (OH)2: <8 pg/mL
Vitamin D3 1, 25 (OH)2: 25 pg/mL

## 2020-10-03 ENCOUNTER — Other Ambulatory Visit: Payer: Self-pay | Admitting: *Deleted

## 2020-10-03 ENCOUNTER — Telehealth: Payer: Self-pay | Admitting: *Deleted

## 2020-10-03 DIAGNOSIS — H02403 Unspecified ptosis of bilateral eyelids: Secondary | ICD-10-CM

## 2020-10-03 NOTE — Telephone Encounter (Signed)
Patient sister called and stated that she has not got a call about ophthalmology referral.  Referral placed to [facility] as pt requested.

## 2020-11-02 ENCOUNTER — Other Ambulatory Visit (HOSPITAL_BASED_OUTPATIENT_CLINIC_OR_DEPARTMENT_OTHER): Payer: Self-pay

## 2020-11-07 ENCOUNTER — Encounter: Payer: Medicare Other | Admitting: Gastroenterology

## 2020-11-20 ENCOUNTER — Telehealth: Payer: Self-pay | Admitting: Emergency Medicine

## 2020-11-20 NOTE — Telephone Encounter (Signed)
RB pt is scheduled on 09/27 to have a CT Chest lung cancer screening and then is scheduled on 11/07 for a CT chest high resolution.  Wanting to see if she needs to have both of these?  Please advise. Thanks

## 2020-11-21 ENCOUNTER — Ambulatory Visit (AMBULATORY_SURGERY_CENTER): Payer: Self-pay

## 2020-11-21 ENCOUNTER — Other Ambulatory Visit: Payer: Self-pay

## 2020-11-21 VITALS — Ht 66.0 in | Wt 169.0 lb

## 2020-11-21 DIAGNOSIS — K219 Gastro-esophageal reflux disease without esophagitis: Secondary | ICD-10-CM

## 2020-11-21 NOTE — Telephone Encounter (Signed)
Routing to PCCs.

## 2020-11-21 NOTE — Telephone Encounter (Signed)
We should cancel the LDCT and go with the high res scan

## 2020-11-21 NOTE — Progress Notes (Signed)
Myriam Jacobson, patient's friend is translating, [place], for this patient  No egg or soy allergy known to patient  No issues with past sedation with any surgeries or procedures Patient denies ever being told they had issues or difficulty with intubation  No FH of Malignant Hyperthermia No diet pills per patient No home 02 use per patient  No blood thinners per patient  Pt denies issues with constipation  No A fib or A flutter  COVID 19 guidelines implemented in [place] today with Pt and RN  Pt is fully vaccinated for Covid x 2 + booster; Due to the COVID-19 pandemic we are asking patients to follow certain guidelines.  Pt aware of COVID protocols and [facility] guidelines   Patient given EGD pamphlet and a copy of the consent Myriam Jacobson reports she will translate for the patient)

## 2020-11-22 NOTE — Telephone Encounter (Signed)
I called [facility] & cancelled lung cancer screening CT and called Tonia [place] and made her aware.  Nothing further needed.

## 2020-11-27 ENCOUNTER — Telehealth: Payer: Self-pay | Admitting: Medical

## 2020-11-27 NOTE — Telephone Encounter (Signed)
OK 

## 2020-11-27 NOTE — Telephone Encounter (Signed)
Patient is calling to change providers from [place] to [place]. Patient states that his sister in law sees [facility] and she asked if Carmelia Roller could also see him. Sister in law is Molli Hazard "Myriam Jacobson" Patient advised that I needed to get prior verification and approval before setting up an appointment of TOC.  Please advise.

## 2020-11-28 NOTE — Telephone Encounter (Signed)
Called pt's sister-in-law (who helps him translate) to set up new patient appointment but she stated she would call back to schedule.

## 2020-12-04 ENCOUNTER — Other Ambulatory Visit (HOSPITAL_BASED_OUTPATIENT_CLINIC_OR_DEPARTMENT_OTHER): Payer: Self-pay

## 2020-12-05 ENCOUNTER — Encounter: Payer: Medicare Other | Admitting: Gastroenterology

## 2020-12-13 ENCOUNTER — Telehealth: Payer: Self-pay

## 2020-12-13 ENCOUNTER — Other Ambulatory Visit: Payer: Self-pay

## 2020-12-13 DIAGNOSIS — K219 Gastro-esophageal reflux disease without esophagitis: Secondary | ICD-10-CM

## 2020-12-13 NOTE — Progress Notes (Signed)
EGD at [facility]. Case cant be done at [facility] due to difficult intubation hx

## 2020-12-13 NOTE — Telephone Encounter (Signed)
Can you request [facility] to give [place] a later time if possible RG

## 2020-12-13 NOTE — Telephone Encounter (Addendum)
Spoke to [place] regarding this patient. The first time I told her that for [facility] that the patient would have to arrive 90 minutes earlier. She did not want to do the Monday 11-21 and was ok with doing a Tuesday 11-22 and I told her that he would probably be the first patient and that might arrive at 6 am but I would have to call to schedule this and she said ok. I called her back and told her this information and she said she can't get up at that time 3 times to me and I asked her would she like a different date since she already declined the 11-21 for the patient and she said no and that we need to move his appointment time down and I told her that we have to go in order regarding scheduling for hospital cases and told me that I needed to talk to you regarding this because they cant be there at 6am. Any advice?

## 2020-12-13 NOTE — Telephone Encounter (Signed)
LVM on Peter Wong's phone  EGD at [facility] is 02-27-2021 arrival time is 6am at [address] [place] and Previsit is 02-06-2021 at 330pm at [address]. She was already fine with these times for the patient regarding scheduling

## 2020-12-13 NOTE — Telephone Encounter (Signed)
.  A user error has taken place: error

## 2020-12-14 NOTE — Telephone Encounter (Signed)
LVM to call back.

## 2020-12-14 NOTE — Telephone Encounter (Signed)
LVM [facility] 11-22 at 11am procedure time and they still have to arrive 90 minutes prior

## 2020-12-15 NOTE — Telephone Encounter (Signed)
Called again said it said that mailbox is full

## 2020-12-27 ENCOUNTER — Ambulatory Visit: Payer: Medicare Other | Admitting: Medical

## 2021-01-02 ENCOUNTER — Ambulatory Visit (HOSPITAL_BASED_OUTPATIENT_CLINIC_OR_DEPARTMENT_OTHER): Payer: Medicare Other

## 2021-01-17 NOTE — Telephone Encounter (Signed)
Myriam Jacobson will pick up instructions today she said for patient for his procedure at [facility]

## 2021-01-24 ENCOUNTER — Encounter: Payer: Self-pay | Admitting: Family Medicine

## 2021-01-24 ENCOUNTER — Other Ambulatory Visit: Payer: Self-pay

## 2021-01-24 ENCOUNTER — Ambulatory Visit (INDEPENDENT_AMBULATORY_CARE_PROVIDER_SITE_OTHER): Payer: Medicare Other | Admitting: Family Medicine

## 2021-01-24 ENCOUNTER — Other Ambulatory Visit (HOSPITAL_BASED_OUTPATIENT_CLINIC_OR_DEPARTMENT_OTHER): Payer: Self-pay

## 2021-01-24 VITALS — BP 132/78 | HR 72 | Temp 99.1°F | Ht 65.0 in | Wt 177.4 lb

## 2021-01-24 DIAGNOSIS — R0609 Other forms of dyspnea: Secondary | ICD-10-CM | POA: Diagnosis not present

## 2021-01-24 DIAGNOSIS — Z23 Encounter for immunization: Secondary | ICD-10-CM

## 2021-01-24 DIAGNOSIS — Z720 Tobacco use: Secondary | ICD-10-CM | POA: Diagnosis not present

## 2021-01-24 DIAGNOSIS — R7303 Prediabetes: Secondary | ICD-10-CM | POA: Diagnosis not present

## 2021-01-24 DIAGNOSIS — I25729 Atherosclerosis of autologous artery coronary artery bypass graft(s) with unspecified angina pectoris: Secondary | ICD-10-CM

## 2021-01-24 MED ORDER — NICOTINE POLACRILEX 2 MG MT GUM
2.0000 mg | CHEWING_GUM | OROMUCOSAL | 2 refills | Status: DC | PRN
  Filled 2021-01-24: qty 100, 17d supply, fill #0

## 2021-01-24 NOTE — Patient Instructions (Addendum)
Keep the diet clean and stay active.  Kudos to cutting down on smoking.  The new Shingrix vaccine (for shingles) is a 2 shot series. It can make people feel low energy, achy and almost like they have the flu for 48 hours after injection. Please plan accordingly when deciding on when to get this shot. Call the pharmacy to get this. The second shot of the series is less severe regarding the side effects, but it still lasts 48 hours.   Nicotine gum is available over the counter.   Let us know if you need anything.

## 2021-01-24 NOTE — Progress Notes (Signed)
Chief Complaint  Patient presents with   transfer of care    Subjective: Hyperlipidemia Patient presents for Hyperlipidemia follow up.  He is here with his wife and a friend who helps interpret. Currently taking Lipitor 40 mg/d and compliance with treatment thus far has been good. He denies myalgias. He is adhering to a healthy diet. Exercise: none The patient is known to have coexisting coronary artery disease.  Smoking Smoking 1/2 ppd.  He was prescribed Wellbutrin and nicotine patches by his pulmonologist.  He continues the Wellbutrin but has not noticed much improvement.  He had to stop the patches as it irritated his skin.  He did try to rotate the areas on the skin that he would use.  He is interested in another nicotine supplementation.  Prediabetes/diabetes? Takes metformin 500 mg/d. No AE's.  It is unclear if he has diabetes or not.  Outside records do not show any A1c readings and 6.3 is a highest A1c reading in his current record.  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 Cont Wellbutrin. Start nicotine gum. >30 min after waking before cigarette, will do 2 mg dose.  Ck lipids, cont Lipitor. Ck A1c. Cont Metformin 500 mg/d. Counseled on diet/exercise. Ck Echo Flu shot today.  F/u in 6 mo. The patient and friend voiced understanding and agreement to the plan.  [place], DO 01/24/21  3:12 PM

## 2021-01-25 ENCOUNTER — Other Ambulatory Visit (INDEPENDENT_AMBULATORY_CARE_PROVIDER_SITE_OTHER): Payer: Medicare Other

## 2021-01-25 ENCOUNTER — Other Ambulatory Visit (HOSPITAL_BASED_OUTPATIENT_CLINIC_OR_DEPARTMENT_OTHER): Payer: Self-pay

## 2021-01-25 DIAGNOSIS — I25729 Atherosclerosis of autologous artery coronary artery bypass graft(s) with unspecified angina pectoris: Secondary | ICD-10-CM | POA: Diagnosis not present

## 2021-01-25 DIAGNOSIS — R7303 Prediabetes: Secondary | ICD-10-CM | POA: Diagnosis not present

## 2021-01-25 LAB — LIPID PANEL
Cholesterol: 158 mg/dL (ref 0–200)
HDL: 47.4 mg/dL (ref >39.00)
LDL Cholesterol: 86 mg/dL (ref 0–99)
NonHDL: 110.14
Total CHOL/HDL Ratio: 3
Triglycerides: 120 mg/dL (ref 0.0–149.0)
VLDL: 24 mg/dL (ref 0.0–40.0)

## 2021-01-25 LAB — HEMOGLOBIN A1C: Hgb A1c MFr Bld: 6 % (ref 4.6–6.5)

## 2021-01-26 ENCOUNTER — Other Ambulatory Visit (HOSPITAL_BASED_OUTPATIENT_CLINIC_OR_DEPARTMENT_OTHER): Payer: Self-pay

## 2021-02-06 ENCOUNTER — Other Ambulatory Visit: Payer: Self-pay | Admitting: Medical

## 2021-02-06 ENCOUNTER — Other Ambulatory Visit (HOSPITAL_BASED_OUTPATIENT_CLINIC_OR_DEPARTMENT_OTHER): Payer: Self-pay

## 2021-02-06 MED ORDER — METOPROLOL SUCCINATE ER 25 MG PO TB24
25.0000 mg | ORAL_TABLET | Freq: Every day | ORAL | 1 refills | Status: DC
  Filled 2021-02-06: qty 90, 90d supply, fill #0
  Filled 2021-05-17: qty 90, 90d supply, fill #1

## 2021-02-09 ENCOUNTER — Other Ambulatory Visit (HOSPITAL_BASED_OUTPATIENT_CLINIC_OR_DEPARTMENT_OTHER): Payer: Self-pay

## 2021-02-12 ENCOUNTER — Ambulatory Visit (HOSPITAL_BASED_OUTPATIENT_CLINIC_OR_DEPARTMENT_OTHER): Payer: Medicare Other

## 2021-02-13 ENCOUNTER — Other Ambulatory Visit: Payer: Self-pay

## 2021-02-13 ENCOUNTER — Ambulatory Visit (HOSPITAL_BASED_OUTPATIENT_CLINIC_OR_DEPARTMENT_OTHER)
Admission: RE | Admit: 2021-02-13 | Discharge: 2021-02-13 | Disposition: A | Discharge status: Home / Self Care | Payer: Medicare Other | Source: Ambulatory Visit | Attending: Emergency Medicine | Admitting: Emergency Medicine

## 2021-02-13 DIAGNOSIS — R0609 Other forms of dyspnea: Secondary | ICD-10-CM | POA: Diagnosis present

## 2021-02-13 DIAGNOSIS — J849 Interstitial pulmonary disease, unspecified: Secondary | ICD-10-CM | POA: Diagnosis present

## 2021-02-14 ENCOUNTER — Ambulatory Visit (HOSPITAL_BASED_OUTPATIENT_CLINIC_OR_DEPARTMENT_OTHER)
Admission: RE | Admit: 2021-02-14 | Discharge: 2021-02-14 | Disposition: A | Discharge status: Home / Self Care | Payer: Medicare Other | Source: Ambulatory Visit | Attending: Family Medicine | Admitting: Family Medicine

## 2021-02-14 DIAGNOSIS — R0609 Other forms of dyspnea: Secondary | ICD-10-CM | POA: Diagnosis not present

## 2021-02-14 LAB — ECHOCARDIOGRAM COMPLETE
AR max vel: 2.42 cm2
AV Area VTI: 2.47 cm2
AV Area mean vel: 2.29 cm2
AV Mean grad: 5 mmHg
AV Peak grad: 9.4 mmHg
Ao pk vel: 1.53 m/s
Area-P 1/2: 4.04 cm2
Calc EF: 64.5 %
S' Lateral: 3.2 cm
Single Plane A2C EF: 58.5 %
Single Plane A4C EF: 69.7 %

## 2021-02-16 ENCOUNTER — Encounter (HOSPITAL_COMMUNITY): Payer: Self-pay | Admitting: Gastroenterology

## 2021-02-16 ENCOUNTER — Other Ambulatory Visit: Payer: Self-pay

## 2021-02-26 NOTE — Anesthesia Preprocedure Evaluation (Addendum)
Anesthesia Evaluation  Patient identified by MRN, date of birth, ID band Patient awake    Reviewed: Allergy & Precautions, Patient's Chart, lab work & pertinent test results  History of Anesthesia Complications (+) DIFFICULT AIRWAY and history of anesthetic complications  Airway Mallampati: III  TM Distance: >3 FB Neck ROM: Full    Dental no notable dental hx. (+) Teeth Intact, Dental Advisory Given   Pulmonary former smoker,    Pulmonary exam normal breath sounds clear to auscultation       Cardiovascular hypertension, Pt. on medications + CAD and + CABG  Normal cardiovascular exam Rhythm:Regular Rate:Normal     Neuro/Psych negative neurological ROS  negative psych ROS   GI/Hepatic negative GI ROS, Neg liver ROS,   Endo/Other  diabetes  Renal/GU      Musculoskeletal  (+) Arthritis ,   Abdominal   Peds  Hematology   Anesthesia Other Findings   Reproductive/Obstetrics                           Anesthesia Physical Anesthesia Plan  ASA: 3  Anesthesia Plan: MAC   Post-op Pain Management:    Induction: Intravenous  PONV Risk Score and Plan: Treatment may vary due to age or medical condition  Airway Management Planned: Natural Airway and Simple Face Mask  Additional Equipment: None  Intra-op Plan:   Post-operative Plan:   Informed Consent: I have reviewed the patients History and Physical, chart, labs and discussed the procedure including the risks, benefits and alternatives for the proposed anesthesia with the patient or authorized representative who has indicated his/her understanding and acceptance.     Dental advisory given  Plan Discussed with:   Anesthesia Plan Comments: (EGD W MAC for hx of difficulty)       Anesthesia Quick Evaluation

## 2021-02-27 ENCOUNTER — Other Ambulatory Visit (HOSPITAL_BASED_OUTPATIENT_CLINIC_OR_DEPARTMENT_OTHER): Payer: Self-pay

## 2021-02-27 ENCOUNTER — Encounter (HOSPITAL_COMMUNITY): Payer: Self-pay | Admitting: Gastroenterology

## 2021-02-27 ENCOUNTER — Ambulatory Visit (HOSPITAL_COMMUNITY): Payer: Medicare Other | Admitting: Certified Registered"

## 2021-02-27 ENCOUNTER — Other Ambulatory Visit: Payer: Self-pay

## 2021-02-27 ENCOUNTER — Ambulatory Visit (HOSPITAL_COMMUNITY)
Admission: RE | Admit: 2021-02-27 | Discharge: 2021-02-27 | Disposition: A | Discharge status: Home / Self Care | Payer: Medicare Other | Attending: Gastroenterology | Admitting: Gastroenterology

## 2021-02-27 ENCOUNTER — Encounter (HOSPITAL_COMMUNITY)
Admission: RE | Disposition: A | Discharge status: Home / Self Care | Payer: Self-pay | Source: Home / Self Care | Attending: Gastroenterology

## 2021-02-27 DIAGNOSIS — R1013 Epigastric pain: Secondary | ICD-10-CM | POA: Diagnosis present

## 2021-02-27 DIAGNOSIS — K2970 Gastritis, unspecified, without bleeding: Secondary | ICD-10-CM | POA: Diagnosis not present

## 2021-02-27 DIAGNOSIS — B9681 Helicobacter pylori [H. pylori] as the cause of diseases classified elsewhere: Secondary | ICD-10-CM | POA: Diagnosis not present

## 2021-02-27 DIAGNOSIS — K219 Gastro-esophageal reflux disease without esophagitis: Secondary | ICD-10-CM | POA: Diagnosis not present

## 2021-02-27 HISTORY — PX: ESOPHAGOGASTRODUODENOSCOPY (EGD) WITH PROPOFOL: SHX5813

## 2021-02-27 HISTORY — PX: BIOPSY: SHX5522

## 2021-02-27 SURGERY — ESOPHAGOGASTRODUODENOSCOPY (EGD) WITH PROPOFOL
Anesthesia: Monitor Anesthesia Care

## 2021-02-27 MED ORDER — EPHEDRINE SULFATE-NACL 50-0.9 MG/10ML-% IV SOSY
PREFILLED_SYRINGE | INTRAVENOUS | Status: DC | PRN
  Administered 2021-02-27: 5 mg via INTRAVENOUS

## 2021-02-27 MED ORDER — SODIUM CHLORIDE 0.9 % IV SOLN: INTRAVENOUS | Status: DC

## 2021-02-27 MED ORDER — PHENYLEPHRINE 40 MCG/ML (10ML) SYRINGE FOR IV PUSH (FOR BLOOD PRESSURE SUPPORT)
PREFILLED_SYRINGE | INTRAVENOUS | Status: DC | PRN
  Administered 2021-02-27: 40 ug via INTRAVENOUS

## 2021-02-27 MED ORDER — LIDOCAINE HCL (CARDIAC) PF 100 MG/5ML IV SOSY
PREFILLED_SYRINGE | INTRAVENOUS | Status: DC | PRN
  Administered 2021-02-27: 100 mg via INTRAVENOUS

## 2021-02-27 MED ORDER — PROPOFOL 10 MG/ML IV BOLUS
INTRAVENOUS | Status: DC | PRN
  Administered 2021-02-27: 20 mg via INTRAVENOUS

## 2021-02-27 MED ORDER — OMEPRAZOLE 20 MG PO CPDR
20.0000 mg | DELAYED_RELEASE_CAPSULE | Freq: Every day | ORAL | 1 refills | Status: DC
  Filled 2021-02-27: qty 28, 28d supply, fill #0

## 2021-02-27 MED ORDER — PROPOFOL 500 MG/50ML IV EMUL
INTRAVENOUS | Status: DC | PRN
  Administered 2021-02-27: 150 ug/kg/min via INTRAVENOUS

## 2021-02-27 MED ORDER — LACTATED RINGERS IV SOLN: INTRAVENOUS | Status: DC

## 2021-02-27 SURGICAL SUPPLY — 14 items

## 2021-02-27 NOTE — Transfer of Care (Signed)
Immediate Anesthesia Transfer of Care Note  Patient: Peter Wong  Procedure(s) Performed: ESOPHAGOGASTRODUODENOSCOPY (EGD) WITH PROPOFOL BIOPSY  Patient Location: PACU  Anesthesia Type:MAC  Level of Consciousness: drowsy  Airway & Oxygen Therapy: Patient Spontanous Breathing and Patient connected to face mask oxygen  Post-op Assessment: Report given to RN and Post -op Vital signs reviewed and stable  Post vital signs: Reviewed and stable  Last Vitals:  Vitals Value Taken Time  BP 90/48 02/27/21 1050  Temp 36.4 C 02/27/21 1045  Pulse 69 02/27/21 1053  Resp 17 02/27/21 1053  SpO2 99 % 02/27/21 1053  Vitals shown include unvalidated device data.  Last Pain:  Vitals:   02/27/21 1050  TempSrc:   PainSc: Asleep         Complications: No notable events documented.

## 2021-02-27 NOTE — Discharge Instructions (Signed)
YOU HAD AN ENDOSCOPIC PROCEDURE TODAY: Refer to the procedure report and other information in the discharge instructions given to you for any specific questions about what was found during the examination. If this information does not answer your questions, please call [place] office at [phone] to clarify.   YOU SHOULD EXPECT: Some feelings of bloating in the abdomen. Passage of more gas than usual. Walking can help get rid of the air that was put into your GI tract during the procedure and reduce the bloating. If you had a lower endoscopy (such as a colonoscopy or flexible sigmoidoscopy) you may notice spotting of blood in your stool or on the toilet paper. Some abdominal soreness may be present for a day or two, also.  DIET: Your first meal following the procedure should be a light meal and then it is ok to progress to your normal diet. A half-sandwich or bowl of soup is an example of a good first meal. Heavy or fried foods are harder to digest and may make you feel nauseous or bloated. Drink plenty of fluids but you should avoid alcoholic beverages for 24 hours. If you had a esophageal dilation, please see attached instructions for diet.    ACTIVITY: Your care partner should take you home directly after the procedure. You should plan to take it easy, moving slowly for the rest of the day. You can resume normal activity the day after the procedure however YOU SHOULD NOT DRIVE, use power tools, machinery or perform tasks that involve climbing or major physical exertion for 24 hours (because of the sedation medicines used during the test).   SYMPTOMS TO REPORT IMMEDIATELY: A gastroenterologist can be reached at any hour. Please call [phone]  for any of the following symptoms:   Following upper endoscopy (EGD, EUS, ERCP, esophageal dilation) Vomiting of blood or coffee ground material  New, significant abdominal pain  New, significant chest pain or pain under the shoulder blades  Painful or  persistently difficult swallowing  New shortness of breath  Black, tarry-looking or red, bloody stools  FOLLOW UP:  If any biopsies were taken you will be contacted by phone or by letter within the next 1-3 weeks. Call [phone]  if you have not heard about the biopsies in 3 weeks.  Please also call with any specific questions about appointments or follow up tests.  

## 2021-02-27 NOTE — H&P (Signed)
Chief Complaint: For GI evaluation   Referring Provider:  Mackie Pai, PA-C        ASSESSMENT AND PLAN;    #1. GERD   #2. Screening   Plan: - Proceed with EGD/colon.  - Omeprazole 20mg  po qd thereafter after    I discussed the nature of the recommended EGD/Colonoscopy , as well as the indications, risks, alternatives and potential complications including, but not limited to, bleeding, infection, reaction to medication, damage to internal organs, cardiac and/or pulmonary problems, and perforation requiring surgery (1 to 2 in 1000). The possibility that significant findings could be missed was explained. All ? were answered. The patient gives consent for the procedures.         Addendum: We were able to obtain more records.  Patient had left the clinic by then. He actually had colonoscopy June 04, 2017 by Dr. Dorrene German which showed small internal hemorrhoids. It was otherwise unremarkable.  The preparation was good.  He recommended to repeat in 10 yrs.   Hence, we would just proceed with EGD and hold off on colon.  Certainly, if he starts having any rectal bleeding, we would change the plan.   Report has been sent for scanning.     HPI:     Peter Wong is a 70 y.o. 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   Has been having occasional heartburn and was quite worried about H. Pylori.  He would occasionally have postprandial abdominal discomfort which gets better on its own.  No family history of stomach cancers.   Had previous colon ?2017 or earlier at [facility] neg. Per friend, he may be due for repeat screening colonoscopy.  We could not find any records in Care Everywhere.   No significant diarrhea or constipation.  No melena or hematochezia. No unintentional weight loss. No abdominal pain.         Past Medical History:  Diagnosis Date    Diabetes mellitus without complication ([place])     Hyperlipidemia     Hypertension             Past Surgical History:  Procedure Laterality Date   COLONOSCOPY        [facility] around 2017   CORONARY ARTERY BYPASS GRAFT   10/2014           Family History  Problem Relation Age of Onset   Lung cancer Mother     Colon cancer Neg Hx     Esophageal cancer Neg Hx        Social History         Tobacco Use   Smoking status: Former Smoker      Packs/day: 1.00      Years: 40.00      Pack years: 40.00      Types: Cigarettes      Quit date: 04/29/2016      Years since quitting: 4.3   Smokeless tobacco: Never Used   Tobacco comment: quit 3 days ago 08/02/20 [facility]   Vaping Use   Vaping Use: Never used  Substance Use Topics   Alcohol use: Not Currently   Drug use: Never            Current Outpatient Medications  Medication Sig Dispense Refill   aspirin 81 MG chewable tablet Chew 1 tablet by  mouth daily.       atorvastatin (LIPITOR) 80 MG tablet Take 1 tablet (80 mg total) by mouth daily. 90 tablet 1   buPROPion (WELLBUTRIN XL) 150 MG 24 hr tablet Take one tablet by mouth once daily. (Patient taking differently: Take one tablet by mouth once daily.) 90 tablet 0   metFORMIN (GLUCOPHAGE) 500 MG tablet Take 1 tablet (500 mg total) by mouth daily with breakfast. 90 tablet 3   metoprolol succinate (TOPROL-XL) 25 MG 24 hr tablet Take 1 tablet (25 mg total) by mouth daily. Take with or immediately following a meal. 90 tablet 1   nicotine (NICODERM CQ - DOSED IN MG/24 HOURS) 21 mg/24hr patch Place 1 patch (21 mg total) onto the skin daily. 30 patch 1   guaifenesin (HUMIBID E) 400 MG TABS tablet Take one tablet by mouth once daily (Patient not taking: Reported on 08/23/2020) 90 tablet 3    No current facility-administered medications for this visit.      No Known Allergies   Review of Systems:  Constitutional: Denies fever, chills, diaphoresis, appetite change and fatigue.  HEENT:  Denies photophobia, eye pain, redness, hearing loss, ear pain, congestion, sore throat, rhinorrhea, sneezing, mouth sores, neck pain, neck stiffness and tinnitus.   Respiratory: Denies SOB, DOE, cough, chest tightness,  and wheezing.   Cardiovascular: Denies chest pain, palpitations and leg swelling.  Genitourinary: Denies dysuria, urgency, frequency, hematuria, flank pain and difficulty urinating.  Musculoskeletal: Denies myalgias, back pain, joint swelling, arthralgias and gait problem.  Skin: No rash.  Neurological: Denies dizziness, seizures, syncope, weakness, light-headedness, numbness and headaches.  Hematological: Denies adenopathy. Easy bruising, personal or family bleeding history  Psychiatric/Behavioral: No anxiety or depression       Physical Exam:     BP 118/62   Pulse (!) 58   Ht 5' 6.93" (1.7 m)   Wt 170 lb 2 oz (77.2 kg)   BMI 26.70 kg/m     Wt Readings from Last 3 Encounters:  08/23/20 170 lb 2 oz (77.2 kg)  08/02/20 169 lb (76.7 kg)  07/26/20 171 lb (77.6 kg)    Constitutional:  Well-developed, in no acute distress. Psychiatric: Normal mood and affect. Behavior is normal. HEENT: Pupils normal.  Conjunctivae are normal. No scleral icterus. Cardiovascular: Normal rate, regular rhythm. No edema Pulmonary/chest: Effort normal and breath sounds normal. No wheezing.  Bilateral fine Rales/rub. Abdominal: Soft, nondistended. Nontender. Bowel sounds active throughout. There are no masses palpable. No hepatomegaly. Rectal: Deferred Neurological: Alert and oriented to person place and time. Skin: Skin is warm and dry. No rashes noted.   Data Reviewed: I have personally reviewed following labs and imaging studies   CBC: CBC Latest Ref Rng & Units 06/04/2018 08/24/2010 08/23/2010  WBC 4.0 - 10.5 K/uL 12.4(H) 14.2(H) 14.3(H)  Hemoglobin 13.0 - 17.0 g/dL 14.2 14.3 16.2  Hematocrit 39.0 - 52.0 % 42.2 40.3 45.1  Platelets 150 - 400 K/uL 145(L) 192 194      CMP: CMP Latest  Ref Rng & Units 06/27/2020 06/04/2018 08/24/2010  Glucose 70 - 99 mg/dL 102(H) 313(H) 257(H)  BUN 6 - 23 mg/dL 14 15 15   Creatinine 0.40 - 1.50 mg/dL 0.79 1.00 0.77  Sodium 135 - 145 mEq/L 139 129(L) 135  Potassium 3.5 - 5.1 mEq/L 4.5 3.2(L) 3.7  Chloride 96 - 112 mEq/L 105 99 103  CO2 19 - 32 mEq/L 28 20(L) 21  Calcium 8.4 - 10.5 mg/dL 9.5 7.7(L) 8.4  Total Protein 6.0 - 8.3  g/dL 7.2 6.3(L) 6.7  Total Bilirubin 0.2 - 1.2 mg/dL 0.7 5.4(U) 0.3  Alkaline Phos 39 - 117 U/L 103 72 83  AST 0 - 37 U/L 22 25 22   ALT 0 - 53 U/L 32 32 38        Radiology Studies:  Imaging Results  CT Chest High Resolution   Result Date: 08/08/2020 CLINICAL DATA:  70 year old male with history of interstitial lung disease. EXAM: CT CHEST WITHOUT CONTRAST TECHNIQUE: Multidetector CT imaging of the chest was performed following the standard protocol without intravenous contrast. High resolution imaging of the lungs, as well as inspiratory and expiratory imaging, was performed. COMPARISON:  Chest CT 07/05/2020. FINDINGS: Cardiovascular: Heart size is normal. There is no significant pericardial fluid, thickening or pericardial calcification. There is aortic atherosclerosis, as well as atherosclerosis of the great vessels of the mediastinum and the coronary arteries, including calcified atherosclerotic plaque in the left main, left anterior descending, left circumflex and right coronary arteries. Calcifications of the aortic valve. Status post median sternotomy for CABG. Mediastinum/Nodes: No pathologically enlarged mediastinal or hilar lymph nodes. Please note that accurate exclusion of hilar adenopathy is limited on noncontrast CT scans. Esophagus is unremarkable in appearance. No axillary lymphadenopathy. Lungs/Pleura: Widespread patchy areas of ground-glass attenuation, septal thickening and thickening of the peribronchovascular interstitium are noted throughout the lungs bilaterally. No traction bronchiectasis or  honeycombing. Findings have a definitive craniocaudal gradient. Inspiratory and expiratory imaging is unremarkable. No acute consolidative airspace disease. No pleural effusions. Small pulmonary nodules are noted, largest of which is in the right middle lobe associated with the minor fissure (axial image 69 of series 5) measuring 6 x 4 mm (mean diameter 5 mm). Upper Abdomen: Unremarkable. Musculoskeletal: Median sternotomy wires. There are no aggressive appearing lytic or blastic lesions noted in the visualized portions of the skeleton. IMPRESSION: 1. The appearance of the lungs is compatible with interstitial lung disease, with a spectrum of findings considered indeterminate for usual interstitial pneumonia (UIP) per current ATS guidelines. 2. Aortic atherosclerosis, in addition to left main and 3 vessel coronary artery disease. Status post median sternotomy for CABG. 3. There are calcifications of the aortic valve. Echocardiographic correlation for evaluation of potential valvular dysfunction may be warranted if clinically indicated. Aortic Atherosclerosis (ICD10-I70.0). Electronically Signed   By: 07/07/2020 M.D.   On: 08/08/2020 15:23           10/08/2020, MD

## 2021-02-27 NOTE — Op Note (Addendum)
[facility] Patient Name: Peter Wong Procedure Date: 02/27/2021 MRN: [id number] Attending MD: Lynann Bologna , MD Date of Birth: [birth year] CSN: [id number] Age: 70 Admit Type: Outpatient Procedure:                Upper GI endoscopy Indications:              Epigastric abdominal pain Providers:                Lynann Bologna, MD, Norman Clay, RN, Lorenza Evangelist,                            RN, Michele Mcalpine Technician Referring MD:              Medicines:                Monitored Anesthesia Care Complications:            No immediate complications. Estimated Blood Loss:     Estimated blood loss: none. Procedure:                Pre-Anesthesia Assessment:                           - Prior to the procedure, a History and Physical                            was performed, and patient medications and                            allergies were reviewed. The patient's tolerance of                            previous anesthesia was also reviewed. The risks                            and benefits of the procedure and the sedation                            options and risks were discussed with the patient.                            All questions were answered, and informed consent                            was obtained. Prior Anticoagulants: The patient has                            taken no previous anticoagulant or antiplatelet                            agents. ASA Grade Assessment: II - A patient with                            mild systemic disease. After reviewing the risks  and benefits, the patient was deemed in                            satisfactory condition to undergo the procedure.                           After obtaining informed consent, the endoscope was                            passed under direct vision. Throughout the                            procedure, the patient's blood pressure, pulse, and                            oxygen  saturations were monitored continuously. The                            [id number]-H190 ([id number]) Olympus endoscope was introduced                            through the mouth, and advanced to the second part                            of duodenum. The upper GI endoscopy was                            accomplished without difficulty. The patient                            tolerated the procedure well. Scope In: Scope Out: Findings:      The examined esophagus was normal with well-defined Z-line at 38 cm from       incisors.      Scattered minimal inflammation characterized by erythema was found in       the stomach. Biopsies were taken with a cold forceps for histology       Sherron Ales protocol).      The examined duodenum was normal. Biopsies for histology were taken with       a cold forceps for evaluation of celiac disease. Impression:               - Minimal gastritis                           - Otherwise normal EGD. Moderate Sedation:      Not Applicable - Patient had care per Anesthesia. Recommendation:           - Patient has a contact number available for                            emergencies. The signs and symptoms of potential                            delayed complications were discussed with the  patient. Return to normal activities tomorrow.                            Written discharge instructions were provided to the                            patient.                           - Resume previous diet.                           - Continue present medications.                           - Trial of omeprazole.                           - Await pathology results.                           - Return to GI clinic in 12 weeks.                           - The findings and recommendations were discussed                            with the patient's family. Procedure Code(s):        --- Professional ---                           [phone],  Esophagogastroduodenoscopy, flexible,                            transoral; with biopsy, single or multiple Diagnosis Code(s):        --- Professional ---                           K29.70, Gastritis, unspecified, without bleeding                           R10.13, Epigastric pain CPT copyright 2019 American Medical Association. All rights reserved. The codes documented in this report are preliminary and upon coder review may  be revised to meet current compliance requirements. Lynann Bologna, MD 02/27/2021 10:48:52 AM This report has been signed electronically. Number of Addenda: 0

## 2021-02-27 NOTE — Anesthesia Postprocedure Evaluation (Signed)
Anesthesia Post Note  Patient: Oliver Spells  Procedure(s) Performed: ESOPHAGOGASTRODUODENOSCOPY (EGD) WITH PROPOFOL BIOPSY     Patient location during evaluation: PACU Anesthesia Type: MAC Level of consciousness: awake and alert Pain management: pain level controlled Vital Signs Assessment: post-procedure vital signs reviewed and stable Respiratory status: spontaneous breathing, nonlabored ventilation, respiratory function stable and patient connected to nasal cannula oxygen Cardiovascular status: stable and blood pressure returned to baseline Postop Assessment: no apparent nausea or vomiting Anesthetic complications: no   No notable events documented.  Last Vitals:  Vitals:   02/27/21 1100 02/27/21 1110  BP: 118/60 125/66  Pulse: 75 70  Resp: 16 14  Temp:    SpO2: 100% 98%    Last Pain:  Vitals:   02/27/21 1110  TempSrc:   PainSc: 0-No pain                 Barnet Glasgow

## 2021-02-28 LAB — SURGICAL PATHOLOGY

## 2021-03-02 ENCOUNTER — Encounter (HOSPITAL_COMMUNITY): Payer: Self-pay | Admitting: Gastroenterology

## 2021-03-04 ENCOUNTER — Encounter: Payer: Self-pay | Admitting: Gastroenterology

## 2021-03-04 NOTE — Progress Notes (Signed)
Steven, ? ?can you let the pt know that gastric bx are + for H pylori. ?Would recommend the following treatment regimen for 14 days: ?Amoxicillin 1gm BID ?Clarithromycin 500mg BID ?Flagyl 500mg BID ?Omeprazole 20 BID  ? ?Can you please order this if no signfiicant interactions noted. Once done with therapy, the patient should wait one month and perform a stool study for H pylori antigen to ensure negative. The PPI needs to be held at least 2 weeks prior to submitting the stool sample. The patient should avoid alcohol while taking Flagyl.  ? ?Thanks ? ?Dr Melton Walls ? ?

## 2021-03-06 ENCOUNTER — Other Ambulatory Visit: Payer: Self-pay

## 2021-03-06 ENCOUNTER — Other Ambulatory Visit (HOSPITAL_BASED_OUTPATIENT_CLINIC_OR_DEPARTMENT_OTHER): Payer: Self-pay

## 2021-03-06 DIAGNOSIS — B9681 Helicobacter pylori [H. pylori] as the cause of diseases classified elsewhere: Secondary | ICD-10-CM

## 2021-03-06 DIAGNOSIS — K2970 Gastritis, unspecified, without bleeding: Secondary | ICD-10-CM

## 2021-03-06 MED ORDER — AMOXICILLIN 500 MG PO CAPS
1000.0000 mg | ORAL_CAPSULE | Freq: Two times a day (BID) | ORAL | 0 refills | Status: AC
  Filled 2021-03-06: qty 56, 14d supply, fill #0

## 2021-03-06 MED ORDER — METRONIDAZOLE 500 MG PO TABS
500.0000 mg | ORAL_TABLET | Freq: Two times a day (BID) | ORAL | 0 refills | Status: AC
Start: 2021-03-06 — End: 2021-03-21
  Filled 2021-03-06: qty 28, 14d supply, fill #0

## 2021-03-06 MED ORDER — OMEPRAZOLE 20 MG PO CPDR
20.0000 mg | DELAYED_RELEASE_CAPSULE | Freq: Two times a day (BID) | ORAL | 0 refills | Status: DC
  Filled 2021-03-06: qty 28, 14d supply, fill #0

## 2021-03-06 MED ORDER — CLARITHROMYCIN 500 MG PO TABS
500.0000 mg | ORAL_TABLET | Freq: Two times a day (BID) | ORAL | 0 refills | Status: AC
  Filled 2021-03-06: qty 28, 14d supply, fill #0

## 2021-03-14 ENCOUNTER — Other Ambulatory Visit (HOSPITAL_BASED_OUTPATIENT_CLINIC_OR_DEPARTMENT_OTHER): Payer: Self-pay

## 2021-03-14 ENCOUNTER — Other Ambulatory Visit: Payer: Self-pay | Admitting: Medical

## 2021-03-14 MED ORDER — ATORVASTATIN CALCIUM 80 MG PO TABS
80.0000 mg | ORAL_TABLET | Freq: Every day | ORAL | 1 refills | Status: DC
  Filled 2021-03-14: qty 90, 90d supply, fill #0
  Filled 2021-07-03: qty 90, 90d supply, fill #1

## 2021-04-04 ENCOUNTER — Other Ambulatory Visit: Payer: Medicare Other

## 2021-04-04 DIAGNOSIS — K2970 Gastritis, unspecified, without bleeding: Secondary | ICD-10-CM

## 2021-04-06 LAB — H. PYLORI ANTIGEN, STOOL: H pylori Ag, Stl: NEGATIVE

## 2021-04-11 ENCOUNTER — Other Ambulatory Visit (HOSPITAL_BASED_OUTPATIENT_CLINIC_OR_DEPARTMENT_OTHER): Payer: Self-pay

## 2021-04-11 ENCOUNTER — Encounter: Payer: Self-pay | Admitting: Emergency Medicine

## 2021-04-11 ENCOUNTER — Ambulatory Visit: Payer: Medicare Other | Admitting: Emergency Medicine

## 2021-04-11 ENCOUNTER — Other Ambulatory Visit: Payer: Self-pay

## 2021-04-11 DIAGNOSIS — R918 Other nonspecific abnormal finding of lung field: Secondary | ICD-10-CM

## 2021-04-11 DIAGNOSIS — J849 Interstitial pulmonary disease, unspecified: Secondary | ICD-10-CM | POA: Diagnosis not present

## 2021-04-11 DIAGNOSIS — R0602 Shortness of breath: Secondary | ICD-10-CM

## 2021-04-11 DIAGNOSIS — R0600 Dyspnea, unspecified: Secondary | ICD-10-CM | POA: Insufficient documentation

## 2021-04-11 MED ORDER — ALBUTEROL SULFATE HFA 108 (90 BASE) MCG/ACT IN AERS
2.0000 | INHALATION_SPRAY | Freq: Four times a day (QID) | RESPIRATORY_TRACT | 6 refills | Status: DC | PRN
  Filled 2021-04-11: qty 8.5, 25d supply, fill #0

## 2021-04-11 NOTE — Assessment & Plan Note (Addendum)
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  Autoimmune panel negative.  Have emphasized the importance of smoking cessation.  He has not smoked since New Year's Day and is working to stop.  On Wellbutrin. Plan to repeat his CT chest at the 1 year mark, 02/2022.

## 2021-04-11 NOTE — Patient Instructions (Signed)
We will plan to repeat your CT scan of the chest in November 2023 to follow for stability, any change. Congratulations on stopping your smoking.  Keep up the good work.  Do not restart Continue your Wellbutrin as you have been taking it We will try starting a new inhaled medication.  You can use albuterol 2 puffs up to every 4 hours if you need it for shortness of breath.  Keep track of whether this medication helps you so we can discuss next visit Follow Dr. Delton Coombes in 6 months or sooner if you have any problems.   2023? 11?? ?? CT ??? ???? ???? ?? ??? ??? ?????. ??? ?????. ?? ?? ??????. ?? ???? ???? Wellbutrin? ??? ?? ?? ??????. ??? ??? ???? ????? ??? ????. ???? ??? ?? ?? 4???? ???? 2 ??? ??? ? ????. ?? ??? ?? ??? ? ??? ? ?? ???? ??? ??? ??????. ??? ?? ?? 6?? ?? ? ??? Dr. Delton Coombes? ?????.

## 2021-04-11 NOTE — Addendum Note (Signed)
Addended by: Dorisann Frames R on: 04/11/2021 10:55 AM   Modules accepted: Orders

## 2021-04-11 NOTE — Addendum Note (Signed)
Addended by: Dorisann Frames R on: 04/11/2021 10:35 AM   Modules accepted: Orders

## 2021-04-11 NOTE — Assessment & Plan Note (Signed)
May be related to his interstitial disease.  No overt obstruction on his PFT.  I think it would be reasonable given his significant tobacco history and possible combined disease to do a trial of albuterol to see if he gets benefit.  He will report back any effects.  If he does benefit then we can consider starting Spiriva.

## 2021-04-11 NOTE — Assessment & Plan Note (Signed)
Again suspect tobacco related.  Stable on a 65-month follow-up November 2022.  Plan to repeat in 1 year

## 2021-04-11 NOTE — Progress Notes (Signed)
Subjective:    Patient ID: Peter Wong, male    DOB: 1950/08/30, 71 y.o.   MRN: [id number]  HPI 71 year old smoker (40 pack years) with CAD/CABG, hypertension, diabetes, hyperlipidemia.  He has a family history of lung cancer in his mother.  He is referred today for abnormal lung cancer screening CT scan that was done on 07/05/2020.  He is working on smoking cessation, just started Wellbutrin. Also has patches. Denies any dyspnea, cough. Has a friend here with him who translates from [place]  CT chest 07/05/2020 reviewed by me, shows no mediastinal or hilar lymphadenopathy, mild centrilobular emphysema and diffuse bronchial wall thickening, extensive patchy groundglass and scattered septal thickening in both lungs most prominent in the lower lobes with some associated traction bronchiolectasis, a few scattered solid pulmonary nodules largest 7 mm in the right middle lobe   ROV 09/07/20 --follow-up visit for 71 year old gentleman history of tobacco use,, CAD/CABG, hypertension, diabetes, hyperlipidemia.  He was found to have emphysematous change and diffuse bronchial wall thickening with some extensive patchy groundglass changes on screening CT scan of the chest 07/05/2020.  Also noted with some traction bronchiolectasis and a few scattered solid pulmonary nodules.  He is not on any bronchodilator therapy. Has smoked only 2 cigarettes since last visit. Needs patches and wellbutrin. Speaks via Nurse, learning disability. He is not having any dyspnea, cough or wheezing. Some decreased appetite.   Labs 08/02/2020 reviewed, ANCA, ACE level, anti-SCL, hypersensitivity pneumonitis panel, SSA and SSB, ANA, RF, CCP, and all other autoimmune labs from the biomarker panel are negative.  The QuantiFERON gold was not done.  High-resolution CT scan of the chest performed 08/08/2020 reviewed by me, shows interstitial changes that are indeterminate for UIP pattern with some widespread patchy areas of groundglass attenuation and septal  thickening peribronchovascular prominence without traction bronchiectasis or honeycomb change.  Largest pulmonary nodule 6 mm in the right middle lobe.   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 Reports that he stopped smoking 04/08/2021, is taking Wellbutrin He has noticed some SOB with walking up stairs. Able to walk indefinitely on flat ground. He coughs  High-resolution CT scan of the chest 02/13/2021 reviewed by me, shows stable fibrotic interstitial disease with slightly decreased groundglass opacity compared with prior in an NSIP pattern.  Pulmonary nodule stable compared with 08/2020.   Review of Systems As per HPI      Objective:   Physical Exam Vitals:   04/11/21 0954  BP: 132/68  Pulse: 71  Temp: 98 F (36.7 C)  TempSrc: Oral  SpO2: 98%  Weight: 170 lb 9.6 oz (77.4 kg)  Height: 5\' 6"  (1.676 m)   Gen: Pleasant, well-nourished, in no distress,  normal affect  ENT: No lesions,  mouth clear,  oropharynx clear, no postnasal drip  Neck: No JVD, no stridor  Lungs: No use of accessory muscles, few insp basilar crackles.   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  Autoimmune panel negative.  Have emphasized the importance of smoking cessation.  He has not smoked since New Year's Day and is working to  stop.  On Wellbutrin. Plan to repeat his CT chest at the 1 year mark, 02/2022.  Pulmonary nodules Again suspect tobacco related.  Stable on a 1-month follow-up November 2022.  Plan to repeat in 1 year  Dyspnea May be related to his interstitial disease.  No overt obstruction on his PFT.  I think it would be reasonable given his significant tobacco history and possible combined disease to do a trial of albuterol to see if he gets benefit.  He will report back any effects.  If he does benefit then we can consider starting Spiriva.  Levy Pupa, MD, PhD 04/11/2021, 10:21 AM [facility] [phone] or if no answer before 7:00PM call [phone] For any issues after 7:00PM please call eLink [phone]

## 2021-04-13 LAB — QUANTIFERON-TB GOLD PLUS
Mitogen-NIL: >10 IU/mL
NIL: 0.06 IU/mL
QuantiFERON-TB Gold Plus: NEGATIVE
TB1-NIL: 0.08 IU/mL
TB2-NIL: 0.17 IU/mL

## 2021-04-26 ENCOUNTER — Other Ambulatory Visit (HOSPITAL_BASED_OUTPATIENT_CLINIC_OR_DEPARTMENT_OTHER): Payer: Self-pay

## 2021-04-26 LAB — HM DIABETES EYE EXAM

## 2021-04-26 MED ORDER — AMOXICILLIN 500 MG PO CAPS
ORAL_CAPSULE | ORAL | 0 refills | Status: DC
  Filled 2021-04-26: qty 21, 7d supply, fill #0

## 2021-04-26 MED ORDER — ERYTHROMYCIN 5 MG/GM OP OINT
TOPICAL_OINTMENT | OPHTHALMIC | 3 refills | Status: DC
  Filled 2021-04-26: qty 3.5, 14d supply, fill #0

## 2021-05-17 ENCOUNTER — Other Ambulatory Visit (HOSPITAL_BASED_OUTPATIENT_CLINIC_OR_DEPARTMENT_OTHER): Payer: Self-pay

## 2021-07-03 ENCOUNTER — Other Ambulatory Visit (HOSPITAL_BASED_OUTPATIENT_CLINIC_OR_DEPARTMENT_OTHER): Payer: Self-pay

## 2021-07-05 ENCOUNTER — Telehealth: Payer: Self-pay | Admitting: *Deleted

## 2021-07-05 ENCOUNTER — Other Ambulatory Visit (HOSPITAL_BASED_OUTPATIENT_CLINIC_OR_DEPARTMENT_OTHER): Payer: Self-pay

## 2021-07-05 NOTE — Telephone Encounter (Signed)
Will send message to [facility] office to please see if they can get this pt scheduled for an appt for pre op clearance. Will send FYI to requesting office the pt will need an in office appt before being cleared.  ?

## 2021-07-05 NOTE — Telephone Encounter (Signed)
Primary Cardiologist:Brian Dulce Sellar, MD ? ?Chart reviewed as part of pre-operative protocol coverage. Because of Peter Wong's past medical history and time since last visit, he/she will require a follow-up visit in order to better assess preoperative cardiovascular risk. ? ?Pre-op covering staff: ?- Please schedule appointment and call patient to inform them. ?- Please contact requesting surgeon's office via preferred method (i.e, phone, fax) to inform them of need for appointment prior to surgery. ? ?If applicable, this message will also be routed to pharmacy pool and/or primary cardiologist for input on holding anticoagulant/antiplatelet agent as requested below so that this information is available at time of patient's appointment.  ? ?Peter Asters, NP  ?07/05/2021, 1:18 PM  ? ?

## 2021-07-05 NOTE — Telephone Encounter (Signed)
? ?  Pre-operative Risk Assessment  ?  ?Patient Name: Peter Wong  ?DOB: 1950-04-24 ?MRN: [id number]  ? ?  ? ?Request for Surgical Clearance   ? ?Procedure:   ECTROPION REPAIR ? ?Date of Surgery:  Clearance 07/19/21                              ?   ?Surgeon:  DR. Dairl Ponder ?Surgeon's Group or Practice Name:  [facility] ?Phone number:  [phone] ?Fax number:  [phone] ?  ?Type of Clearance Requested:   ?- Pharmacy:  Hold Aspirin x 7 DAYS PRIOR TO SURGERY ?  ?Type of Anesthesia:   IV SEDATION ?  ?Additional requests/questions:   ? ?Signed, ?Danielle Rankin   ?07/05/2021, 12:49 PM  ? ?

## 2021-07-06 NOTE — Telephone Encounter (Signed)
Pt has appt with Dr. Bing Matter 07/10/21 for pre op clearance. I will forward notes to MD for upcoming appt. Will send FYI to requesting office the pt has appt  ?

## 2021-07-10 ENCOUNTER — Ambulatory Visit: Payer: Medicare Other | Admitting: Cardiology

## 2021-07-10 ENCOUNTER — Encounter: Payer: Self-pay | Admitting: Cardiology

## 2021-07-10 ENCOUNTER — Telehealth: Payer: Self-pay | Admitting: Cardiology

## 2021-07-10 VITALS — BP 124/66 | HR 75 | Ht 67.0 in | Wt 171.4 lb

## 2021-07-10 DIAGNOSIS — Z951 Presence of aortocoronary bypass graft: Secondary | ICD-10-CM

## 2021-07-10 DIAGNOSIS — R7303 Prediabetes: Secondary | ICD-10-CM

## 2021-07-10 DIAGNOSIS — I25729 Atherosclerosis of autologous artery coronary artery bypass graft(s) with unspecified angina pectoris: Secondary | ICD-10-CM

## 2021-07-10 DIAGNOSIS — I10 Essential (primary) hypertension: Secondary | ICD-10-CM | POA: Diagnosis not present

## 2021-07-10 DIAGNOSIS — F17210 Nicotine dependence, cigarettes, uncomplicated: Secondary | ICD-10-CM

## 2021-07-10 NOTE — Progress Notes (Signed)
?Cardiology Office Note:   ? ?Date:  07/10/2021  ? ?ID:  Peter Wong, DOB 09/24/1950, MRN [id number] ? ?PCP:  Sharlene Dory, DO  ?Cardiologist:  Gypsy Balsam, MD   ? ?Referring MD: Sharlene Dory*  ? ?Chief Complaint  ?Patient presents with  ? Medical Clearance  ?  07/19/2021 Dr. Dairl Ponder  Surgical [facility] bilatera; eyes  ? ? ?History of Present Illness:   ? ?Peter Wong is a 71 y.o. male with past medical history significant for coronary artery disease, he did have coronary artery bypass graft done in [facility] in 2016 with LIMA to LAD, saphenous graft to diagonal obtuse marginal and right coronary artery.Marland Kitchen  He does have history of non-ST segment elevation of myocardial infarction in July 2016.  Also essential hypertension, prediabetes, sadly he still continues to smoke.  Also had history of dyslipidemia. ?He was sent to [place] for evaluation before eye surgery.  The main question is about potentially stopping his aspirin.  Overall he is doing very well.  He is very active he can walk climb stairs with no difficulty.  Still continues to smoke.  He smokes about 1/3 pack/day. ? ?Past Medical History:  ?Diagnosis Date  ? 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 Diabetes mellitus without complication (HCC)   ? on meds  ? Hyperlipidemia   ? on meds  ? Hypertension   ? on meds  ? ? ?Past Surgical History:  ?Procedure Laterality Date  ? BIOPSY  02/27/2021  ? Procedure: BIOPSY;  Surgeon: Lynann Bologna, MD;  Location: [facility];  Service: Endoscopy;;  ? COLONOSCOPY  06/04/2017  ?  [facility]. Normal mucous in the whole colon.Internal hemorrhoids. Colonoscopy was otherwise normal  ? CORONARY ARTERY BYPASS GRAFT  10/2014  ? ESOPHAGOGASTRODUODENOSCOPY (EGD) WITH PROPOFOL N/A 02/27/2021  ? Procedure: ESOPHAGOGASTRODUODENOSCOPY (EGD) WITH PROPOFOL;  Surgeon: Lynann Bologna, MD;  [facility];  Service:  Endoscopy;  Laterality: N/A;  ? ? ?Current Medications: ?Current Meds  ?Medication Sig  ? albuterol (VENTOLIN HFA) 108 (90 Base) MCG/ACT inhaler Inhale 2 puffs by mouth into the lungs every 6 (six) hours as needed for wheezing or shortness of breath.  ? aspirin 81 MG chewable tablet Chew 1 tablet by mouth daily.  ? atorvastatin (LIPITOR) 80 MG tablet Take 1 tablet (80 mg total) by mouth daily.  ? buPROPion (WELLBUTRIN XL) 150 MG 24 hr tablet Take one tablet by mouth once daily. (Patient taking differently: Take 150 mg by mouth daily. Take one tablet by mouth once daily.)  ? erythromycin ophthalmic ointment Apply to incisions and lashes 3 times daily on both eyes for 2 weeks (Patient taking differently: Place 1 application. into both eyes 2 (two) times a week.)  ? icosapent Ethyl (VASCEPA) 1 g capsule Take 2 g by mouth 2 (two) times daily.  ? metFORMIN (GLUCOPHAGE) 500 MG tablet Take 1 tablet (500 mg total) by mouth daily with breakfast.  ? metoprolol succinate (TOPROL-XL) 25 MG 24 hr tablet Take 1 tablet (25 mg total) by mouth daily. Take with or immediately following a meal.  ? [DISCONTINUED] amoxicillin (AMOXIL) 500 MG capsule Take 1 tablet by mouth every 8 hours until finished  ?  ? ?Allergies:   Patient has no known allergies.  ? ?Social History  ? ?Socioeconomic History  ? Marital status: Married  ?  Spouse name: Not on file  ? Number of children: Not  on file  ? Years of education: Not on file  ? Highest education level: Not on file  ?Occupational History  ? Not on file  ?Tobacco Use  ? Smoking status: Former  ?  Packs/day: 1.00  ?  Years: 40.00  ?  Pack years: 40.00  ?  Types: Cigarettes  ?  Quit date: 04/29/2016  ?  Years since quitting: 5.2  ? Smokeless tobacco: Never  ? Tobacco comments:  ?  Pt hasn't smoked since end of year  ?Vaping Use  ? Vaping Use: Never used  ?Substance and Sexual Activity  ? Alcohol use: Not Currently  ? Drug use: Never  ? Sexual activity: Not Currently  ?Other Topics Concern  ? Not  on file  ?Social History Narrative  ? Not on file  ? ?Social Determinants of Health  ? ?Financial Resource Strain: Not on file  ?Food Insecurity: Not on file  ?Transportation Needs: Not on file  ?Physical Activity: Not on file  ?Stress: Not on file  ?Social Connections: Not on file  ?  ? ?Family History: ?The patient's family history includes Lung cancer in his mother. There is no history of Colon cancer, Esophageal cancer, Colon polyps, Rectal cancer, or Stomach cancer. ?ROS:   ?Please see the history of present illness.    ?All 14 point review of systems negative except as described per history of present illness ? ?EKGs/Labs/Other Studies Reviewed:   ? ?Echocardiogram done last year showed: ?IMPRESSIONS  ? ? ? 1. Left ventricular ejection fraction, by estimation, is 60 to 65%. The  ?left ventricle has normal function. The left ventricle has no regional  ?wall motion abnormalities. Left ventricular diastolic parameters are  ?consistent with Grade I diastolic  ?dysfunction (impaired relaxation). The average left ventricular global  ?longitudinal strain is -19.9 %. The global longitudinal strain is normal.  ? 2. Right ventricular systolic function is mildly reduced. The right  ?ventricular size is normal.  ? 3. The mitral valve is normal in structure. No evidence of mitral valve  ?regurgitation. No evidence of mitral stenosis.  ? 4. The aortic valve is tricuspid. Aortic valve regurgitation is not  ?visualized. No aortic stenosis is present.  ? 5. The inferior vena cava is normal in size with greater than 50%  ?respiratory variability, suggesting right atrial pressure of 3 mmHg.  ? ?Recent Labs: ?09/26/2020: ALT 31; BUN 14; Creatinine, Ser 0.86; Hemoglobin 14.2; Platelets 190.0; Potassium 4.0; Sodium 138; TSH 0.78  ?Recent Lipid Panel ?   ?Component Value Date/Time  ? CHOL 158 01/25/2021 0821  ? TRIG 120.0 01/25/2021 0821  ? HDL 47.40 01/25/2021 0821  ? CHOLHDL 3 01/25/2021 0821  ? VLDL 24.0 01/25/2021 0821  ? LDLCALC  86 01/25/2021 0821  ? ? ?Physical Exam:   ? ?VS:  BP 124/66 (BP Location: Left Arm, Patient Position: Sitting)   Pulse 75   Ht 5\' 7"  (1.702 m)   Wt 171 lb 6.4 oz (77.7 kg)   SpO2 97%   BMI 26.85 kg/m?    ? ?Wt Readings from Last 3 Encounters:  ?07/10/21 171 lb 6.4 oz (77.7 kg)  ?04/11/21 170 lb 9.6 oz (77.4 kg)  ?02/27/21 167 lb (75.8 kg)  ?  ? ?GEN:  Well nourished, well developed in no acute distress ?HEENT: Normal ?NECK: No JVD; No carotid bruits ?LYMPHATICS: No lymphadenopathy ?CARDIAC: RRR, no murmurs, no rubs, no gallops ?RESPIRATORY:  Clear to auscultation without rales, wheezing or rhonchi  ?ABDOMEN: Soft, non-tender, non-distended ?MUSCULOSKELETAL:  No edema; No deformity  ?SKIN: Warm and dry ?LOWER EXTREMITIES: no swelling ?NEUROLOGIC:  Alert and oriented x 3 ?PSYCHIATRIC:  Normal affect  ? ?ASSESSMENT:   ? ?1. Presence of aortocoronary bypass graft   ?2. Cigarette nicotine dependence without complication   ?3. Primary hypertension   ?4. 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 Prediabetes   ? ?PLAN:   ? ?In order of problems listed above: ? ?Cardiovascular evaluation before eye surgery.  Overall this surgery is considered very low risk.  Also risk of anesthesia is very low.  The main question is about aspirin.  Obviously from cardiac standpoint reviewed ideal scenario would be continue aspirin without any interruption, however, if surgeon thinks that doing surgery with aspirin on board will be too risky and risk of bleeding will be too high then aspirin can be interrupted for 5 to 7 days before surgery.  Obviously have to be restarted as soon as feasible from surgical point review.  Luckily overall he is doing very well.  He denies have any signs and symptoms that would suggest reactivation of obstructive coronary artery disease.   ?Cigarette smoking which is a big deal.  Obviously he have to stop.  I had a long discussion with him as  well as to his family about this he understand he will try to do that. ?Dyslipidemia I will check his direct LDL today.  Dated the do have a from October of last year with LDL of 86 HDL 47 he is already on h

## 2021-07-10 NOTE — Patient Instructions (Signed)

## 2021-07-11 ENCOUNTER — Ambulatory Visit: Payer: Medicare Other | Admitting: Cardiology

## 2021-07-11 ENCOUNTER — Other Ambulatory Visit (HOSPITAL_BASED_OUTPATIENT_CLINIC_OR_DEPARTMENT_OTHER): Payer: Self-pay

## 2021-07-11 MED ORDER — ERYTHROMYCIN 5 MG/GM OP OINT
TOPICAL_OINTMENT | OPHTHALMIC | 3 refills | Status: DC
  Filled 2021-07-11: qty 3.5, 14d supply, fill #0

## 2021-07-31 LAB — HM DIABETES EYE EXAM

## 2021-08-01 ENCOUNTER — Encounter: Payer: Medicare Other | Admitting: Family Medicine

## 2021-08-03 ENCOUNTER — Encounter: Payer: Self-pay | Admitting: Family Medicine

## 2021-08-21 ENCOUNTER — Encounter: Payer: Medicare Other | Admitting: Family Medicine

## 2021-08-29 ENCOUNTER — Telehealth: Payer: Self-pay | Admitting: *Deleted

## 2021-08-29 NOTE — Telephone Encounter (Signed)
I will forward back to pre op pool for provider review that this is a new request for procedure to be done.

## 2021-08-29 NOTE — Telephone Encounter (Signed)
   Primary Cardiologist: Norman Herrlich, MD  Chart reviewed as part of pre-operative protocol coverage. Given past medical history and time since last visit, based on ACC/AHA guidelines, Peter Wong would be at acceptable risk for the planned procedure without further cardiovascular testing.   His aspirin may be held for 5 to 7 days prior to surgery.  Please resume as soon as hemostasis is achieved.  He is very low risk from cardiac standpoint for his upcoming procedure.  I will route this recommendation to the requesting party via Epic fax function and remove from pre-op pool.  Please call with questions.  Thomasene Ripple. Purvi Ruehl NP-C    08/29/2021, 12:51 PM [facility] [address] Office [phone] Fax [phone]

## 2021-08-29 NOTE — Telephone Encounter (Signed)
  Peter Wong - [facility] calling to clarify that this procedure and a new procedure for the pt. Last April the procedure was for lower eyelid, this time its the pt's upper eyelid and needing a new clearance for it

## 2021-08-29 NOTE — Telephone Encounter (Signed)
   Pre-operative Risk Assessment    Patient Name: Peter Wong  DOB: [birth year] MRN: [id number]      Request for Surgical Clearance    Procedure:   BLEPHAROPLASTY, B/L UPPER EYELIDS WITH FAT PAD/PTOSI  Date of Surgery:  Clearance 09/12/21                                 Surgeon:  DR. Dairl Ponder Surgeon's Group or Practice Name:  [facility] Phone number:  [phone] EXT [address] Scherrie November, NP Fax number:  [phone]   Type of Clearance Requested:   - Medical  REQUEST TO HOLD ASA x 7 DAYS PRIOR TO PROCEDURE   Type of Anesthesia:   IV SEDATION   Additional requests/questions:    Peter Wong   08/29/2021, 9:53 AM

## 2021-08-29 NOTE — Telephone Encounter (Signed)
Preoperative team, patient was cleared by their primary cardiologist on 07/10/2021 for the requested surgery.    Please forward the 07/10/21 office note to the requesting surgeon's office.  Thank you for your help.  I will remove the patient from the preoperative pool.  Jossie Ng. Rickardo Brinegar NP-C    08/29/2021, 10:14 AM [address] Office [phone] Fax [phone]

## 2021-08-31 ENCOUNTER — Telehealth: Payer: Self-pay | Admitting: Cardiology

## 2021-08-31 NOTE — Telephone Encounter (Signed)
Did not need this encounter °

## 2021-08-31 NOTE — Telephone Encounter (Signed)
Scherrie November, NP called back to see if the pt has been cleared the correct procedure for the upper eyelid procedure. I stated yes that Edd Fabian, FNP faxed over a new clearance. I did go over the recommendations to hold ASA 5-7 days and resume as soon as hemostasis. I assured [name] that I will fax over the new clearance recommendations from Edd Fabian, FNP.

## 2021-09-10 ENCOUNTER — Other Ambulatory Visit (HOSPITAL_BASED_OUTPATIENT_CLINIC_OR_DEPARTMENT_OTHER): Payer: Self-pay

## 2021-09-10 ENCOUNTER — Other Ambulatory Visit: Payer: Self-pay | Admitting: Medical

## 2021-09-10 MED ORDER — METOPROLOL SUCCINATE ER 25 MG PO TB24
25.0000 mg | ORAL_TABLET | Freq: Every day | ORAL | 1 refills | Status: DC
  Filled 2021-09-10: qty 90, 90d supply, fill #0
  Filled 2021-12-17: qty 90, 90d supply, fill #1

## 2021-09-18 ENCOUNTER — Encounter: Payer: Medicare Other | Admitting: Family Medicine

## 2021-09-25 ENCOUNTER — Encounter: Payer: Self-pay | Admitting: Family Medicine

## 2021-09-25 ENCOUNTER — Other Ambulatory Visit (HOSPITAL_BASED_OUTPATIENT_CLINIC_OR_DEPARTMENT_OTHER): Payer: Self-pay

## 2021-09-25 ENCOUNTER — Ambulatory Visit (INDEPENDENT_AMBULATORY_CARE_PROVIDER_SITE_OTHER): Payer: Medicare Other | Admitting: Family Medicine

## 2021-09-25 ENCOUNTER — Other Ambulatory Visit: Payer: Self-pay | Admitting: Family Medicine

## 2021-09-25 VITALS — BP 110/68 | HR 63 | Temp 98.1°F | Ht 66.0 in | Wt 173.2 lb

## 2021-09-25 DIAGNOSIS — Z125 Encounter for screening for malignant neoplasm of prostate: Secondary | ICD-10-CM

## 2021-09-25 DIAGNOSIS — B353 Tinea pedis: Secondary | ICD-10-CM | POA: Diagnosis not present

## 2021-09-25 DIAGNOSIS — Z720 Tobacco use: Secondary | ICD-10-CM

## 2021-09-25 DIAGNOSIS — E1165 Type 2 diabetes mellitus with hyperglycemia: Secondary | ICD-10-CM

## 2021-09-25 DIAGNOSIS — Z1159 Encounter for screening for other viral diseases: Secondary | ICD-10-CM

## 2021-09-25 DIAGNOSIS — Z0000 Encounter for general adult medical examination without abnormal findings: Secondary | ICD-10-CM | POA: Diagnosis not present

## 2021-09-25 DIAGNOSIS — Z23 Encounter for immunization: Secondary | ICD-10-CM

## 2021-09-25 DIAGNOSIS — Z136 Encounter for screening for cardiovascular disorders: Secondary | ICD-10-CM

## 2021-09-25 LAB — LIPID PANEL
Cholesterol: 163 mg/dL (ref 0–200)
HDL: 36.1 mg/dL — ABNORMAL LOW (ref >39.00)
NonHDL: 127.29
Total CHOL/HDL Ratio: 5
Triglycerides: 243 mg/dL — ABNORMAL HIGH (ref 0.0–149.0)
VLDL: 48.6 mg/dL — ABNORMAL HIGH (ref 0.0–40.0)

## 2021-09-25 LAB — PSA: PSA: 0.46 ng/mL (ref 0.10–4.00)

## 2021-09-25 LAB — COMPREHENSIVE METABOLIC PANEL
ALT: 28 U/L (ref 0–53)
AST: 20 U/L (ref 0–37)
Albumin: 4.1 g/dL (ref 3.5–5.2)
Alkaline Phosphatase: 91 U/L (ref 39–117)
BUN: 12 mg/dL (ref 6–23)
CO2: 30 mEq/L (ref 19–32)
Calcium: 9.1 mg/dL (ref 8.4–10.5)
Chloride: 104 mEq/L (ref 96–112)
Creatinine, Ser: 0.83 mg/dL (ref 0.40–1.50)
GFR: 89.39 mL/min (ref >60.00)
Glucose, Bld: 100 mg/dL — ABNORMAL HIGH (ref 70–99)
Potassium: 4.2 mEq/L (ref 3.5–5.1)
Sodium: 140 mEq/L (ref 135–145)
Total Bilirubin: 0.7 mg/dL (ref 0.2–1.2)
Total Protein: 6.8 g/dL (ref 6.0–8.3)

## 2021-09-25 LAB — CBC
HCT: 45 % (ref 39.0–52.0)
Hemoglobin: 15.2 g/dL (ref 13.0–17.0)
MCHC: 33.6 g/dL (ref 30.0–36.0)
MCV: 94.4 fl (ref 78.0–100.0)
Platelets: 206 10*3/uL (ref 150.0–400.0)
RBC: 4.77 Mil/uL (ref 4.22–5.81)
RDW: 13.5 % (ref 11.5–15.5)
WBC: 5.4 10*3/uL (ref 4.0–10.5)

## 2021-09-25 LAB — HEMOGLOBIN A1C: Hgb A1c MFr Bld: 6.3 % (ref 4.6–6.5)

## 2021-09-25 LAB — MICROALBUMIN / CREATININE URINE RATIO
Creatinine,U: 98.3 mg/dL
Microalb Creat Ratio: 14.8 mg/g (ref 0.0–30.0)
Microalb, Ur: 14.5 mg/dL — ABNORMAL HIGH (ref 0.0–1.9)

## 2021-09-25 LAB — LDL CHOLESTEROL, DIRECT: Direct LDL: 92 mg/dL

## 2021-09-25 MED ORDER — KETOCONAZOLE 2 % EX CREA
1.0000 | TOPICAL_CREAM | Freq: Every day | CUTANEOUS | 0 refills | Status: AC
  Filled 2021-09-25: qty 30, 30d supply, fill #0

## 2021-09-25 MED ORDER — ATORVASTATIN CALCIUM 80 MG PO TABS
80.0000 mg | ORAL_TABLET | Freq: Every day | ORAL | 1 refills | Status: DC
  Filled 2021-09-25: qty 90, 90d supply, fill #0
  Filled 2022-01-30: qty 90, 90d supply, fill #1

## 2021-09-25 NOTE — Progress Notes (Signed)
Chief Complaint  Patient presents with   Annual Exam    Well Male Peter Wong is here for a complete physical.   His last physical was >1 year ago.  Current diet: in general, a "healthy" diet.   Current exercise: walking Weight trend: stable Fatigue out of ordinary? No. Seat belt? Yes.   Advanced directive? No  Health maintenance Shingrix- No Colonoscopy- Yes Tetanus- No Hep C- No Pneumonia vaccine-due for PCV 20 AAA screening- No  Patient has clear evidence of athlete's foot on his exam, does not have any pain or itching.  He is [place] and wears some sort of shoe/foot wear even while inside.  He is diabetic.  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 Normal mucous in the whole colon.Internal hemorrhoids. Colonoscopy was otherwise normal   CORONARY ARTERY BYPASS GRAFT  10/2014   ESOPHAGOGASTRODUODENOSCOPY (EGD) WITH PROPOFOL N/A 02/27/2021   Procedure: ESOPHAGOGASTRODUODENOSCOPY (EGD) WITH PROPOFOL;  Surgeon: Lynann Bologna, MD;  [facility];  Service: Endoscopy;  Laterality: N/A;    Medications  Current Outpatient Medications on File Prior to Visit  Medication Sig Dispense Refill   albuterol (VENTOLIN HFA) 108 (90 Base) MCG/ACT inhaler Inhale 2 puffs by mouth into the lungs every 6 (six) hours as needed for wheezing or shortness of breath. 8.5 g 6   aspirin 81 MG chewable tablet Chew 1 tablet by mouth daily.     buPROPion (WELLBUTRIN XL) 150 MG 24 hr tablet Take one tablet by mouth once daily. (Patient taking differently: Take  150 mg by mouth daily. Take one tablet by mouth once daily.) 90 tablet 0   icosapent Ethyl (VASCEPA) 1 g capsule Take 2 g by mouth 2 (two) times daily.     metoprolol succinate (TOPROL-XL) 25 MG 24 hr tablet Take 1 tablet (25 mg total) by mouth daily. Take with or immediately following a meal. 90 tablet 1   Allergies No Known Allergies  Family History Family History  Problem Relation Age of Onset   Lung cancer Mother    Colon cancer Neg Hx    Esophageal cancer Neg Hx    Colon polyps Neg Hx    Rectal cancer Neg Hx    Stomach cancer Neg Hx     Review of Systems: Constitutional:  no fevers Eye:  no recent significant change in vision Ears:  No changes in hearing Nose/Mouth/Throat:  no complaints of nasal congestion, no sore throat Cardiovascular: no chest pain Respiratory:  No shortness of breath Gastrointestinal:  No change in bowel habits GU:  No frequency Integumentary:  no abnormal skin lesions reported Neurologic:  no headaches Endocrine:  denies unexplained weight changes  Exam BP 110/68   Pulse 63   Temp 98.1 F (36.7 C) (Oral)   Ht 5\' 6"  (1.676 m)   Wt 173 lb 4 oz (78.6 kg)   SpO2 98%   BMI 27.96 kg/m  General:  well developed, well nourished, in no apparent distress Skin: Macerated tissue between digits 1  through 5 on the right and digits 2 through 4 on the left, otherwise no significant moles, warts, or growths Head:  no masses, lesions, or tenderness Eyes:  pupils equal and round, sclera anicteric without injection Ears:  canals without lesions, TMs shiny without retraction, no obvious effusion, no erythema Nose:  nares patent, septum midline, mucosa normal Throat/Pharynx:  lips and gingiva without lesion; tongue and uvula midline; non-inflamed pharynx; no exudates or postnasal drainage Lungs:  clear to auscultation, breath sounds equal bilaterally, no respiratory distress Cardio:  regular rate and rhythm, no LE edema or bruits;  Rectal: Deferred GI: BS+, S,  NT, ND, no masses or organomegaly Musculoskeletal:  symmetrical muscle groups noted without atrophy or deformity Neuro:  gait normal; deep tendon reflexes normal and symmetric; sensation intact to pinprick bilaterally over the feet Psych: well oriented with normal range of affect and appropriate judgment/insight  Assessment and Plan  Well adult exam  Type 2 diabetes mellitus with hyperglycemia, without long-term current use of insulin (HCC) - Plan: CBC, Comprehensive metabolic panel, Lipid panel, Hemoglobin A1c, Microalbumin / creatinine urine ratio, atorvastatin (LIPITOR) 80 MG tablet  Screening PSA (prostate specific antigen) - Plan: PSA  Encounter for hepatitis C screening test for low risk patient - Plan: Hepatitis C antibody  Tinea pedis of both feet - Plan: ketoconazole (NIZORAL) 2 % cream  Tobacco abuse  Screening for AAA (abdominal aortic aneurysm) - Plan: [place] AORTA MEDICARE SCREENING  Need for pneumococcal 20-valent conjugate vaccination - Plan: Pneumococcal conjugate vaccine 20-valent (Prevnar 20)   Well 71 y.o. male. Counseled on diet and exercise. Advanced directive form provided today.  PCV20 today. Counseled on risks and benefits of prostate cancer screening, agreed to undergo testing today.  If normal, will stop screening as he will be 72 next year. Tdap and Shingrix rec'd at pharmacy. Tinea pedis-6 weeks of ketoconazole daily as above, try to keep clean and dry overall. Recommended tobacco cessation, order AAA screening. Other orders as above. Follow up in 6 mo.  The patient and his spouse voiced understanding and agreement to the plan.  Jilda Roche [place], DO 09/25/21 8:23 AM

## 2021-09-25 NOTE — Patient Instructions (Addendum)
Give [place] 2-3 business days to get the results of your labs back.   Keep the diet clean and stay active.  Please get me a copy of your advanced directive form at your convenience.   The Shingrix vaccine (for shingles) is a 2 shot series spaced 2-6 months apart. It can make people feel low energy, achy and almost like they have the flu for 48 hours after injection. 1/5 people can have nausea and/or vomiting. Please plan accordingly when deciding on when to get this shot. Call your pharmacy to get this. The second shot of the series is less severe regarding the side effects, but it still lasts 48 hours.   Consider going to the pharmacy to get the tetanus shot as well. Insurance will not cover it here.   Someone will reach out to you regarding your abdominal ultrasound.   Let us know if you need anything.

## 2021-09-26 LAB — HEPATITIS C ANTIBODY: Hepatitis C Ab: NONREACTIVE

## 2021-10-19 ENCOUNTER — Other Ambulatory Visit (HOSPITAL_BASED_OUTPATIENT_CLINIC_OR_DEPARTMENT_OTHER): Payer: Self-pay

## 2021-10-19 ENCOUNTER — Ambulatory Visit: Payer: Medicare Other | Admitting: Emergency Medicine

## 2021-10-19 ENCOUNTER — Encounter: Payer: Self-pay | Admitting: Emergency Medicine

## 2021-10-19 DIAGNOSIS — R918 Other nonspecific abnormal finding of lung field: Secondary | ICD-10-CM | POA: Diagnosis not present

## 2021-10-19 DIAGNOSIS — J849 Interstitial pulmonary disease, unspecified: Secondary | ICD-10-CM

## 2021-10-19 DIAGNOSIS — F17210 Nicotine dependence, cigarettes, uncomplicated: Secondary | ICD-10-CM | POA: Diagnosis not present

## 2021-10-19 NOTE — Patient Instructions (Signed)
Keep the albuterol available so you can use 2 puffs if you need it for shortness of breath, chest tightness, wheezing. We will not start a scheduled inhaler medication at this time We will plan to repeat your high-resolution CT scan of the chest in November 2023 to compare with priors. It is important for you to work on stopping smoking.  We will try weaning down your cigarettes as follows: -Decrease to 5 cigarettes daily for 2 weeks -Then decrease to 3 cigarettes daily for 2 weeks -Then decrease to 2 cigarettes daily for 2 weeks -Then decrease to 1 cigarette daily for 2 weeks -Then set your quit date and stop altogether. Follow Dr. Delton Coombes in November after your CT scan so we can review the results of that test together.

## 2021-10-19 NOTE — Assessment & Plan Note (Signed)
He had stopped smoking but he started back since I last saw him.  He wanted me to try helping him create strategy to wean his cigarettes down and ultimately to off.  I have written a schedule for him to try.  Hold off on restart Wellbutrin for now   It is important for you to work on stopping smoking.  We will try weaning down your cigarettes as follows: -Decrease to 5 cigarettes daily for 2 weeks -Then decrease to 3 cigarettes daily for 2 weeks -Then decrease to 2 cigarettes daily for 2 weeks -Then decrease to 1 cigarette daily for 2 weeks -Then set your quit date and stop altogether. Follow Dr. Delton Coombes in November after your CT scan so we can review the results of that test together.

## 2021-10-19 NOTE — Progress Notes (Signed)
   Subjective:    Patient ID: Peter Wong, male    DOB: 1950/11/04, 71 y.o.   MRN: [id number]  HPI  ROV 10/19/21 --follow-up visit for 71 year old man with history of prior heavy tobacco use and associated COPD.  Since last time he has restarted smoking. Smoking 7 cig a day. Also with interstitial lung disease with GI and scattered pulmonary nodular disease most consistent with RB-ILD.  Next CT chest due November 2023.  Today he reports that he restarted smoking as above.  He is motivated to cut down.  At his last visit I tried giving him albuterol to use as needed to see if he derived any benefit. He hasn't really used it, denies any SOB. No cough.    Review of Systems As per HPI     Objective:   Physical Exam Vitals:   10/19/21 1114  BP: 118/64  Pulse: 69  Temp: 98.3 F (36.8 C)  TempSrc: Oral  SpO2: 97%  Weight: 173 lb 6.4 oz (78.7 kg)  Height: 5\' 5"  (1.651 m)   Gen: Pleasant, well-nourished, in no distress,  normal affect  ENT: No lesions,  mouth clear,  oropharynx clear, no postnasal drip  Neck: No JVD, no stridor  Lungs: No use of accessory muscles, few insp basilar crackles.   Cardiovascular: RRR, heart sounds normal, no murmur or gallops, no peripheral edema  Musculoskeletal: No deformities, no cyanosis or clubbing  Neuro: alert, awake, non focal  Skin: Warm, no lesions or rash      Assessment & Plan:  Cigarette nicotine dependence without complication He had stopped smoking but he started back since I last saw him.  He wanted me to try helping him create strategy to wean his cigarettes down and ultimately to off.  I have written a schedule for him to try.  Hold off on restart Wellbutrin for now   It is important for you to work on stopping smoking.  We will try weaning down your cigarettes as follows: -Decrease to 5 cigarettes daily for 2 weeks -Then decrease to 3 cigarettes daily for 2 weeks -Then decrease to 2 cigarettes daily for 2 weeks -Then decrease to 1  cigarette daily for 2 weeks -Then set your quit date and stop altogether. Follow Dr. in November after your CT scan so we can review the results of that test together.  ILD (interstitial lung disease) (HCC) Interstitial changes and some micronodular disease, most consistent with RB-ILD.  Unfortunately he is back to smoking which is the main contributor here.  His repeat CT chest is scheduled for November 2023.  Follow-up after this is done.  Pulmonary nodules Again likely due to RB-ILD.  We will repeat his CT in November  December, MD, PhD 10/19/2021, 11:42 AM [facility] [phone] or if no answer before 7:00PM call [phone] For any issues after 7:00PM please call eLink [phone]

## 2021-10-19 NOTE — Assessment & Plan Note (Signed)
Interstitial changes and some micronodular disease, most consistent with RB-ILD.  Unfortunately he is back to smoking which is the main contributor here.  His repeat CT chest is scheduled for November 2023.  Follow-up after this is done.

## 2021-10-19 NOTE — Assessment & Plan Note (Signed)
Again likely due to RB-ILD.  We will repeat his CT in November

## 2021-11-02 ENCOUNTER — Ambulatory Visit (HOSPITAL_BASED_OUTPATIENT_CLINIC_OR_DEPARTMENT_OTHER): Payer: Medicare Other

## 2021-11-06 ENCOUNTER — Other Ambulatory Visit: Payer: Medicare Other

## 2021-11-08 ENCOUNTER — Other Ambulatory Visit (HOSPITAL_BASED_OUTPATIENT_CLINIC_OR_DEPARTMENT_OTHER): Payer: Self-pay

## 2021-11-08 MED ORDER — ERYTHROMYCIN 5 MG/GM OP OINT
TOPICAL_OINTMENT | OPHTHALMIC | 3 refills | Status: DC
  Filled 2021-11-08: qty 3.5, 14d supply, fill #0

## 2021-11-12 ENCOUNTER — Other Ambulatory Visit: Payer: Self-pay | Admitting: *Deleted

## 2021-11-12 DIAGNOSIS — E782 Mixed hyperlipidemia: Secondary | ICD-10-CM

## 2021-11-13 ENCOUNTER — Other Ambulatory Visit (INDEPENDENT_AMBULATORY_CARE_PROVIDER_SITE_OTHER): Payer: Medicare Other

## 2021-11-13 DIAGNOSIS — E782 Mixed hyperlipidemia: Secondary | ICD-10-CM | POA: Diagnosis not present

## 2021-11-13 LAB — LIPID PANEL
Cholesterol: 151 mg/dL (ref 0–200)
HDL: 38.8 mg/dL — ABNORMAL LOW (ref >39.00)
LDL Cholesterol: 86 mg/dL (ref 0–99)
NonHDL: 112.15
Total CHOL/HDL Ratio: 4
Triglycerides: 132 mg/dL (ref 0.0–149.0)
VLDL: 26.4 mg/dL (ref 0.0–40.0)

## 2021-11-14 ENCOUNTER — Other Ambulatory Visit (HOSPITAL_BASED_OUTPATIENT_CLINIC_OR_DEPARTMENT_OTHER): Payer: Self-pay

## 2021-11-14 MED ORDER — DOXYCYCLINE HYCLATE 100 MG PO TABS
ORAL_TABLET | ORAL | 0 refills | Status: DC
Start: 2021-11-14 — End: 2022-05-27
  Filled 2021-11-14: qty 10, 5d supply, fill #0

## 2021-11-27 ENCOUNTER — Ambulatory Visit (HOSPITAL_BASED_OUTPATIENT_CLINIC_OR_DEPARTMENT_OTHER)
Admission: RE | Admit: 2021-11-27 | Discharge: 2021-11-27 | Disposition: A | Discharge status: Home / Self Care | Payer: Medicare Other | Source: Ambulatory Visit | Attending: Family Medicine | Admitting: Family Medicine

## 2021-11-27 ENCOUNTER — Telehealth: Payer: Self-pay

## 2021-11-27 ENCOUNTER — Ambulatory Visit (HOSPITAL_BASED_OUTPATIENT_CLINIC_OR_DEPARTMENT_OTHER): Payer: Medicare Other

## 2021-11-27 DIAGNOSIS — F17210 Nicotine dependence, cigarettes, uncomplicated: Secondary | ICD-10-CM | POA: Diagnosis not present

## 2021-11-27 DIAGNOSIS — Z136 Encounter for screening for cardiovascular disorders: Secondary | ICD-10-CM | POA: Insufficient documentation

## 2021-11-27 NOTE — Telephone Encounter (Signed)
   Pre-operative Risk Assessment    Patient Name: Peter Wong  DOB: 1950/10/21 MRN: [id number]      Request for Surgical Clearance    Procedure:   BLEPHAROPLASTY, BOTH UPPER LIDS WITH FAT PAD/PTOSI  Date of Surgery:  Clearance 12/06/21                                 Surgeon: DR. Dairl Ponder  Surgeon's Group or Practice Name:  [facility] Phone number:  [phone] Fax number:  [phone]   Type of Clearance Requested:   - Pharmacy:  Hold Aspirin 7 DAYS BEFORE SURGERY AND RESTART ON POD #3 FOR TOTAL OF 10 DAYS PER DR. Darrell Jewel REQUEST   Type of Anesthesia:   IV SEDATION    Additional requests/questions:    SignedMichaelle Copas   11/27/2021, 5:23 PM

## 2021-11-28 ENCOUNTER — Telehealth: Payer: Self-pay | Admitting: *Deleted

## 2021-11-28 NOTE — Telephone Encounter (Signed)
I was able to s/w the pt using [employer] # [phone] [name]. Pt agreeable to plan of care for tele pre op appt 11/30/21. Pt states her eye surgery has been moved up to 12/05/21 from 12/06/21. In regard to this, I did instruct the pt that she is to not take anymore ASA after today, until Dr. Darcel Bayley tells her when to re-start the ASA after surgery. Pt has been scheduled for tele appt 11/30/21 @ 10:20. We will need to call the pt through [facility] again on Friday 11/30/21. Med rec not done. Consent has been given.   [employer] # is [phone]

## 2021-11-28 NOTE — Telephone Encounter (Signed)
His aspirin may be held for 5-7 days prior to his procedure.  Please resume as soon as possible postoperatively at the discretion of the surgeon.  Preoperative team, please contact this patient and set up a phone call appointment for further cardiac evaluation.  Thank you for your help.  Thomasene Ripple. Carlen Rebuck NP-C    11/28/2021, 12:54 PM [facility] [address] Office [phone] Fax [phone]

## 2021-11-28 NOTE — Telephone Encounter (Signed)
I was able to s/w the pt using [employer] # [phone] Peter Wong. Pt agreeable to plan of care for tele pre op appt 11/30/21. Pt states her eye surgery has been moved up to 12/05/21 from 12/06/21. In regard to this, I did instruct the pt that she is to not take anymore ASA after today, until Dr. Darcel Bayley tells her when to re-start the ASA after surgery. Pt has been scheduled for tele appt 11/30/21 @ 10:20. We will need to call the pt through [facility] again on Friday 11/30/21. Med rec not done. Consent has been given.   [employer] # is [phone]    Patient Consent for Virtual Visit        Peter Wong has provided verbal consent on 11/28/2021 for a virtual visit (video or telephone).   CONSENT FOR VIRTUAL VISIT FOR:  Peter Wong  By participating in this virtual visit I agree to the following:  I hereby voluntarily request, consent and authorize [facility] and its employed or contracted physicians, Producer, television/film/video, nurse practitioners or other licensed health care professionals (the Practitioner), to provide me with telemedicine health care services (the "Services") as deemed necessary by the treating Practitioner. I acknowledge and consent to receive the Services by the Practitioner via telemedicine. I understand that the telemedicine visit will involve communicating with the Practitioner through live audiovisual communication technology and the disclosure of certain medical information by electronic transmission. I acknowledge that I have been given the opportunity to request an in-person assessment or other available alternative prior to the telemedicine visit and am voluntarily participating in the telemedicine visit.  I understand that I have the right to withhold or withdraw my consent to the use of telemedicine in the course of my care at any time, without affecting my right to future care or treatment, and that the Practitioner or I may terminate the telemedicine visit at  any time. I understand that I have the right to inspect all information obtained and/or recorded in the course of the telemedicine visit and may receive copies of available information for a reasonable fee.  I understand that some of the potential risks of receiving the Services via telemedicine include:  Delay or interruption in medical evaluation due to technological equipment failure or disruption; Information transmitted may not be sufficient (e.g. poor resolution of images) to allow for appropriate medical decision making by the Practitioner; and/or  In rare instances, security protocols could fail, causing a breach of personal health information.  Furthermore, I acknowledge that it is my responsibility to provide information about my medical history, conditions and care that is complete and accurate to the best of my ability. I acknowledge that Practitioner's advice, recommendations, and/or decision may be based on factors not within their control, such as incomplete or inaccurate data provided by me or distortions of diagnostic images or specimens that may result from electronic transmissions. I understand that the practice of medicine is not an exact science and that Practitioner makes no warranties or guarantees regarding treatment outcomes. I acknowledge that a copy of this consent can be made available to me via my patient portal [facility] MyChart), or I can request a printed copy by calling the office of [facility].    I understand that my insurance will be billed for this visit.   I have read or had this consent read to me. I understand the contents of this consent, which adequately explains the benefits and risks of the Services being provided  via telemedicine.  I have been provided ample opportunity to ask questions regarding this consent and the Services and have had my questions answered to my satisfaction. I give my informed consent for the services to be provided through the use  of telemedicine in my medical care

## 2021-11-29 ENCOUNTER — Other Ambulatory Visit (HOSPITAL_BASED_OUTPATIENT_CLINIC_OR_DEPARTMENT_OTHER): Payer: Self-pay

## 2021-11-29 MED ORDER — ERYTHROMYCIN 5 MG/GM OP OINT
1.0000 | TOPICAL_OINTMENT | Freq: Three times a day (TID) | OPHTHALMIC | 3 refills | Status: DC
  Filled 2021-11-29: qty 3.5, 14d supply, fill #0

## 2021-11-30 ENCOUNTER — Ambulatory Visit (INDEPENDENT_AMBULATORY_CARE_PROVIDER_SITE_OTHER): Payer: Medicare Other | Admitting: Physician Assistant

## 2021-11-30 DIAGNOSIS — Z01810 Encounter for preprocedural cardiovascular examination: Secondary | ICD-10-CM

## 2021-11-30 NOTE — Progress Notes (Signed)
Virtual Visit via Telephone Note   Because of Peter Wong's co-morbid illnesses, he is at least at moderate risk for complications without adequate follow up.  This format is felt to be most appropriate for this patient at this time.  The patient did not have access to video technology/had technical difficulties with video requiring transitioning to audio format only (telephone).  All issues noted in this document were discussed and addressed.  No physical exam could be performed with this format.  Please refer to the patient's chart for his consent to telehealth for [facility].  Evaluation Performed:  Preoperative cardiovascular risk assessment _____________   Date:  11/30/2021   Patient ID:  Peter Wong, Peter Wong 1950-04-09, MRN [id number] Patient Location:  Home Provider location:   Office  Primary Care Provider:  Sharlene Dory, DO Primary Cardiologist:  Norman Herrlich, MD  Chief Complaint / Patient Profile   71 y.o. y/o male with a h/o CAD with coronary artery bypass grafting, hyperlipidemia, hypertension, DM who is pending BLEPHAROPLASTY, BOTH UPPER LIDS WITH FAT PAD/PTOSI and presents today for telephonic preoperative cardiovascular risk assessment.  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 Normal mucous in the whole colon.Internal hemorrhoids. Colonoscopy was otherwise normal   CORONARY ARTERY BYPASS GRAFT  10/2014   ESOPHAGOGASTRODUODENOSCOPY (EGD) WITH PROPOFOL N/A 02/27/2021   Procedure:  ESOPHAGOGASTRODUODENOSCOPY (EGD) WITH PROPOFOL;  Surgeon: Lynann Bologna, MD;  Location: [facility];  Service: Endoscopy;  Laterality: N/A;    Allergies  No Known Allergies  History of Present Illness    Peter Wong is a 71 y.o. male who presents via audio/video conferencing for a telehealth visit today.  Pt was last seen in cardiology clinic on 07/10/2021 by Dr. Bing Matter.  At that time Peter Wong was doing well .  The patient is now pending procedure as outlined above. Since his last visit, he has been doing well without any cardiovascular complaints.  He has not had any chest pain or shortness of breath.  He is very active and is able to walk up stairs without any difficulty.  He also has no issues with yard work and can participate in strenuous sports and recreational activities.  Because of this he has scored a 7.28 on the DASI which well exceeds the minimum 4 METS requirement.   He is able to hold aspirin 5 to 7 days prior to the procedure.  Since his procedure is 8/30 he is already holding his aspirin.  Home Medications    Prior to Admission medications   Medication Sig Start Date End Date Taking? Authorizing Provider  albuterol (VENTOLIN HFA) 108 (90 Base) MCG/ACT inhaler Inhale 2 puffs by mouth into the lungs every 6 (six) hours as needed for wheezing or shortness of breath. 04/11/21   Leslye Peer, MD  aspirin 81 MG chewable tablet Chew 1 tablet by mouth daily. 11/11/14   [provider]  atorvastatin (LIPITOR) 80 MG tablet Take 1 tablet (80 mg total) by  mouth daily. 09/25/21   Sharlene Dory, DO  buPROPion (WELLBUTRIN XL) 150 MG 24 hr tablet Take one tablet by mouth once daily. Patient taking differently: Take 150 mg by mouth daily. Take one tablet by mouth once daily. 09/07/20   Leslye Peer, MD  doxycycline (VIBRA-TABS) 100 MG tablet Take 1 tablet by mouth 2 times every day for 5 days 11/14/21     erythromycin ophthalmic ointment Apply to incisions and lashes 3 times daily  on both eyes for 2 weeks Patient taking differently: Place 1 application  into both eyes 2 (two) times a week. 04/26/21     erythromycin ophthalmic ointment Apply to incisions and lashes 3 times a day on BOTH eyes for 2 weeks 07/11/21     erythromycin ophthalmic ointment AFTER SURGERY, apply to incisions and lashes 3 times a day on BOTH eyes for 2 weeks. 11/08/21     erythromycin ophthalmic ointment AFTER SURGERY apply to incisions and lashes 3 times a day on BOTH eyes for 2 weeks 11/29/21     icosapent Ethyl (VASCEPA) 1 g capsule Take 2 g by mouth 2 (two) times daily.    [provider]  metoprolol succinate (TOPROL-XL) 25 MG 24 hr tablet Take 1 tablet (25 mg total) by mouth daily. Take with or immediately following a meal. 09/10/21   [place], Ramon Dredge, PA-C    Physical Exam    Vital Signs:  Peter Wong does not have vital signs available for review today.  Given telephonic nature of communication, physical exam is limited. AAOx3. NAD. Normal affect.  Speech and respirations are unlabored.  Accessory Clinical Findings    None  Assessment & Plan    1.  Preoperative Cardiovascular Risk Assessment:  Peter Wong perioperative risk of a major cardiac event is 0.9% according to the Revised Cardiac Risk Index (RCRI).  Therefore, he is at low risk for perioperative complications.   His functional capacity is excellent at 7.28 METs according to the Duke Activity Status Index (DASI). Recommendations: According to ACC/AHA guidelines, no further cardiovascular testing needed.  The patient may proceed to surgery at acceptable risk.   Antiplatelet and/or Anticoagulation Recommendations: Aspirin can be held for 7 days prior to his surgery.  Please resume Aspirin post operatively when it is felt to be safe from a bleeding standpoint.    A copy of this note will be routed to requesting surgeon.  Time:   Today, I have spent 15 minutes with the patient with telehealth technology discussing medical history,  symptoms, and management plan.     Sharlene Dory, PA-C  11/30/2021, 10:24 AM

## 2021-12-11 ENCOUNTER — Other Ambulatory Visit (HOSPITAL_BASED_OUTPATIENT_CLINIC_OR_DEPARTMENT_OTHER): Payer: Self-pay

## 2021-12-17 ENCOUNTER — Ambulatory Visit: Payer: Medicare Other | Admitting: Cardiology

## 2021-12-17 ENCOUNTER — Other Ambulatory Visit (HOSPITAL_BASED_OUTPATIENT_CLINIC_OR_DEPARTMENT_OTHER): Payer: Self-pay

## 2022-01-30 ENCOUNTER — Other Ambulatory Visit (HOSPITAL_BASED_OUTPATIENT_CLINIC_OR_DEPARTMENT_OTHER): Payer: Self-pay

## 2022-02-20 ENCOUNTER — Ambulatory Visit (HOSPITAL_BASED_OUTPATIENT_CLINIC_OR_DEPARTMENT_OTHER): Payer: Medicare Other | Attending: Emergency Medicine

## 2022-03-05 ENCOUNTER — Other Ambulatory Visit (HOSPITAL_BASED_OUTPATIENT_CLINIC_OR_DEPARTMENT_OTHER): Payer: Self-pay

## 2022-03-05 MED ORDER — FLUAD QUADRIVALENT 0.5 ML IM PRSY
PREFILLED_SYRINGE | INTRAMUSCULAR | 0 refills | Status: DC
  Filled 2022-03-05 – 2022-03-26 (×3): qty 0.5, 1d supply, fill #0

## 2022-03-26 ENCOUNTER — Other Ambulatory Visit (HOSPITAL_BASED_OUTPATIENT_CLINIC_OR_DEPARTMENT_OTHER): Payer: Self-pay

## 2022-03-26 ENCOUNTER — Other Ambulatory Visit: Payer: Self-pay | Admitting: Medical

## 2022-03-26 MED ORDER — COMIRNATY 30 MCG/0.3ML IM SUSY
PREFILLED_SYRINGE | INTRAMUSCULAR | 0 refills | Status: DC
Start: 2022-03-26 — End: 2022-06-03
  Filled 2022-03-26: qty 0.3, 1d supply, fill #0

## 2022-03-26 MED ORDER — METOPROLOL SUCCINATE ER 25 MG PO TB24
25.0000 mg | ORAL_TABLET | Freq: Every day | ORAL | 0 refills | Status: DC
  Filled 2022-03-26: qty 30, 30d supply, fill #0

## 2022-05-17 ENCOUNTER — Other Ambulatory Visit (HOSPITAL_BASED_OUTPATIENT_CLINIC_OR_DEPARTMENT_OTHER): Payer: Self-pay

## 2022-05-17 ENCOUNTER — Other Ambulatory Visit: Payer: Self-pay | Admitting: Family Medicine

## 2022-05-17 DIAGNOSIS — E1165 Type 2 diabetes mellitus with hyperglycemia: Secondary | ICD-10-CM

## 2022-05-17 MED ORDER — METOPROLOL SUCCINATE ER 25 MG PO TB24
25.0000 mg | ORAL_TABLET | Freq: Every day | ORAL | 0 refills | Status: DC
  Filled 2022-05-17: qty 30, 30d supply, fill #0

## 2022-05-17 MED ORDER — ATORVASTATIN CALCIUM 80 MG PO TABS
80.0000 mg | ORAL_TABLET | Freq: Every day | ORAL | 1 refills | Status: DC
  Filled 2022-05-17: qty 90, 90d supply, fill #0

## 2022-05-27 ENCOUNTER — Ambulatory Visit (INDEPENDENT_AMBULATORY_CARE_PROVIDER_SITE_OTHER)
Admission: RE | Admit: 2022-05-27 | Discharge: 2022-05-27 | Disposition: A | Discharge status: Home / Self Care | Payer: Medicare Other | Source: Ambulatory Visit | Attending: Family Medicine | Admitting: Family Medicine

## 2022-05-27 ENCOUNTER — Encounter: Payer: Self-pay | Admitting: Family Medicine

## 2022-05-27 ENCOUNTER — Ambulatory Visit (INDEPENDENT_AMBULATORY_CARE_PROVIDER_SITE_OTHER): Payer: Medicare Other | Admitting: Family Medicine

## 2022-05-27 ENCOUNTER — Other Ambulatory Visit (HOSPITAL_BASED_OUTPATIENT_CLINIC_OR_DEPARTMENT_OTHER): Payer: Self-pay

## 2022-05-27 VITALS — BP 120/78 | HR 93 | Temp 98.2°F | Ht 66.0 in | Wt 176.4 lb

## 2022-05-27 DIAGNOSIS — R051 Acute cough: Secondary | ICD-10-CM

## 2022-05-27 MED ORDER — BENZONATATE 200 MG PO CAPS
200.0000 mg | ORAL_CAPSULE | Freq: Two times a day (BID) | ORAL | 0 refills | Status: DC | PRN
  Filled 2022-05-27: qty 20, 10d supply, fill #0

## 2022-05-27 NOTE — Progress Notes (Signed)
Chief Complaint  Patient presents with   Cough    For 2 weeks Congestion Stopped smoking 6 weeks ago    Peter Wong here for URI complaints. Here w a friend who helps interpret ([place]).   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 ([place])    Diabetes mellitus without complication ([place])    on meds   Hyperlipidemia    on meds   Hypertension    on meds    Objective BP 120/78 (BP Location: Left Arm, Patient Position: Sitting, Cuff Size: Normal)   Pulse 93   Temp 98.2 F (36.8 C) (Oral)   Ht 5' 6"$  (1.676 m)   Wt 176 lb 6 oz (80 kg)   SpO2 94%   BMI 28.47 kg/m  General: Awake, alert, appears stated age HEENT: AT, [place], ears patent b/l and TM's neg, nares patent w/o discharge, pharynx pink and without exudates, MMM Neck: No masses or asymmetry Heart: RRR Lungs: CTAB, no accessory muscle use Psych: Age appropriate judgment and insight, normal mood and affect  Acute cough - Plan: DG Chest 2 View  Ck CXR. Will go to [facility] to avoid facility fee. Tessalon Perles prn in meanwhile.  Pt and his friend voiced understanding and agreement to the plan.  [place], DO 05/27/22 2:08 PM

## 2022-05-27 NOTE — Patient Instructions (Signed)
Continue to push fluids, practice good hand hygiene, and cover your mouth if you cough.  If you start having fevers, shaking or shortness of breath, seek immediate care.  Please get your X-ray done in the basement of our [place] office located on: [address], [place] [address]  You do not need an appointment for that location.   Let us know if you need anything.

## 2022-05-29 ENCOUNTER — Other Ambulatory Visit (HOSPITAL_BASED_OUTPATIENT_CLINIC_OR_DEPARTMENT_OTHER): Payer: Self-pay

## 2022-05-29 ENCOUNTER — Other Ambulatory Visit: Payer: Self-pay | Admitting: Family Medicine

## 2022-05-29 MED ORDER — FUROSEMIDE 40 MG PO TABS
40.0000 mg | ORAL_TABLET | Freq: Every day | ORAL | 0 refills | Status: DC
  Filled 2022-05-29: qty 30, 30d supply, fill #0

## 2022-05-29 MED ORDER — POTASSIUM CHLORIDE ER 10 MEQ PO TBCR
EXTENDED_RELEASE_TABLET | ORAL | 0 refills | Status: DC
  Filled 2022-05-29: qty 30, 30d supply, fill #0

## 2022-06-03 ENCOUNTER — Encounter: Payer: Self-pay | Admitting: Family Medicine

## 2022-06-03 ENCOUNTER — Ambulatory Visit (INDEPENDENT_AMBULATORY_CARE_PROVIDER_SITE_OTHER): Payer: Medicare Other | Admitting: Family Medicine

## 2022-06-03 VITALS — BP 112/76 | HR 68 | Temp 98.0°F | Ht 66.0 in | Wt 174.5 lb

## 2022-06-03 DIAGNOSIS — E1165 Type 2 diabetes mellitus with hyperglycemia: Secondary | ICD-10-CM | POA: Diagnosis not present

## 2022-06-03 DIAGNOSIS — J810 Acute pulmonary edema: Secondary | ICD-10-CM | POA: Diagnosis not present

## 2022-06-03 NOTE — Patient Instructions (Addendum)
Give [place] 2-3 business days to get the results of your labs back.   We will be in touch regarding your heart scan.  Mind your salt intake.  Please reach out to Dr. Lamonte Sakai.   Let us know if you need anything.

## 2022-06-03 NOTE — Progress Notes (Signed)
Chief Complaint  Patient presents with   Cough    Subjective: Patient is a 72 y.o. male here for f/u. Here w friend who helps interpret.   Pt seen 1 week ago for a 2 week cough. He had quite smoking 6 weeks prior at that time. Coughing up clear/white sputum. No chest pain, SOB, fevers, URI s/s's. CXR showed fluid on lungs. No swelling in legs, hx of HF, hep/renal failure. No pleural effusion was noted. He was started on Lasix + K and reports compliance, urinating freq. He is down 2 lbs from last week.   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 No accessory muscle use Psych: Age appropriate judgment and insight, normal affect and mood  Assessment and Plan: Acute pulmonary edema (HCC) - Plan: Comprehensive metabolic panel, ECHOCARDIOGRAM COMPLETE  Type 2 diabetes mellitus with hyperglycemia, without long-term current use of insulin (HCC) - Plan: Hemoglobin A1c  Cont Lasix for now. Monitor above labs. Ck Echo. He will make a f/u appt w his pulm doc. F/u as originally scheduled.  The patient and his friend voiced understanding and agreement to the plan.  [place], DO 06/03/22  2:22 PM

## 2022-06-04 LAB — COMPREHENSIVE METABOLIC PANEL
ALT: 34 U/L (ref 0–53)
AST: 25 U/L (ref 0–37)
Albumin: 4.1 g/dL (ref 3.5–5.2)
Alkaline Phosphatase: 98 U/L (ref 39–117)
BUN: 21 mg/dL (ref 6–23)
CO2: 30 mEq/L (ref 19–32)
Calcium: 9.5 mg/dL (ref 8.4–10.5)
Chloride: 102 mEq/L (ref 96–112)
Creatinine, Ser: 0.96 mg/dL (ref 0.40–1.50)
GFR: 80.34 mL/min (ref >60.00)
Glucose, Bld: 146 mg/dL — ABNORMAL HIGH (ref 70–99)
Potassium: 4.2 mEq/L (ref 3.5–5.1)
Sodium: 139 mEq/L (ref 135–145)
Total Bilirubin: 0.7 mg/dL (ref 0.2–1.2)
Total Protein: 7.1 g/dL (ref 6.0–8.3)

## 2022-06-04 LAB — HEMOGLOBIN A1C: Hgb A1c MFr Bld: 6.3 % (ref 4.6–6.5)

## 2022-06-05 ENCOUNTER — Other Ambulatory Visit: Payer: Self-pay

## 2022-06-05 ENCOUNTER — Telehealth: Payer: Self-pay | Admitting: Emergency Medicine

## 2022-06-05 DIAGNOSIS — R918 Other nonspecific abnormal finding of lung field: Secondary | ICD-10-CM

## 2022-06-05 DIAGNOSIS — J849 Interstitial pulmonary disease, unspecified: Secondary | ICD-10-CM

## 2022-06-05 NOTE — Telephone Encounter (Signed)
Re-submitted order for Chest C

## 2022-06-05 NOTE — Telephone Encounter (Signed)
VOID

## 2022-06-05 NOTE — Telephone Encounter (Signed)
The CT was scheduled & pt no showed.  This order has expired & will require a new order.

## 2022-06-05 NOTE — Telephone Encounter (Signed)
Dr. Lamonte Sakai wanted a CT sched for this PT on last visit. AVS notes read as follows:  Follow Dr. Lamonte Sakai in November after your CT scan so we can review the results of that test together.   Pls call to set up one in [place] area if poss. I will set out appt for him. Bonnita Nasuti, his friend, is thge Eng speaking contact.

## 2022-06-14 ENCOUNTER — Ambulatory Visit (HOSPITAL_BASED_OUTPATIENT_CLINIC_OR_DEPARTMENT_OTHER): Payer: Medicare Other

## 2022-06-18 ENCOUNTER — Ambulatory Visit (HOSPITAL_BASED_OUTPATIENT_CLINIC_OR_DEPARTMENT_OTHER)
Admission: RE | Admit: 2022-06-18 | Discharge: 2022-06-18 | Disposition: A | Discharge status: Home / Self Care | Payer: Medicare Other | Source: Ambulatory Visit | Attending: Family Medicine | Admitting: Family Medicine

## 2022-06-18 DIAGNOSIS — J810 Acute pulmonary edema: Secondary | ICD-10-CM | POA: Diagnosis present

## 2022-06-18 LAB — ECHOCARDIOGRAM COMPLETE
Area-P 1/2: 4.19 cm2
S' Lateral: 3.1 cm

## 2022-06-20 ENCOUNTER — Ambulatory Visit (HOSPITAL_BASED_OUTPATIENT_CLINIC_OR_DEPARTMENT_OTHER)
Admission: RE | Admit: 2022-06-20 | Discharge: 2022-06-20 | Disposition: A | Discharge status: Home / Self Care | Payer: Medicare Other | Source: Ambulatory Visit | Attending: Emergency Medicine | Admitting: Emergency Medicine

## 2022-06-20 DIAGNOSIS — J849 Interstitial pulmonary disease, unspecified: Secondary | ICD-10-CM | POA: Diagnosis present

## 2022-06-20 DIAGNOSIS — R918 Other nonspecific abnormal finding of lung field: Secondary | ICD-10-CM | POA: Diagnosis present

## 2022-06-25 ENCOUNTER — Other Ambulatory Visit: Payer: Self-pay | Admitting: Family Medicine

## 2022-06-25 ENCOUNTER — Ambulatory Visit (INDEPENDENT_AMBULATORY_CARE_PROVIDER_SITE_OTHER): Payer: Medicare Other | Admitting: Family Medicine

## 2022-06-25 ENCOUNTER — Other Ambulatory Visit (HOSPITAL_BASED_OUTPATIENT_CLINIC_OR_DEPARTMENT_OTHER): Payer: Self-pay

## 2022-06-25 ENCOUNTER — Encounter: Payer: Self-pay | Admitting: Family Medicine

## 2022-06-25 VITALS — BP 124/64 | HR 65 | Temp 97.8°F | Ht 66.0 in | Wt 177.0 lb

## 2022-06-25 DIAGNOSIS — J811 Chronic pulmonary edema: Secondary | ICD-10-CM

## 2022-06-25 MED ORDER — METOPROLOL SUCCINATE ER 25 MG PO TB24
25.0000 mg | ORAL_TABLET | Freq: Every day | ORAL | 0 refills | Status: DC
  Filled 2022-06-25: qty 30, 30d supply, fill #0

## 2022-06-25 MED ORDER — METOPROLOL SUCCINATE ER 25 MG PO TB24
25.0000 mg | ORAL_TABLET | Freq: Every day | ORAL | 2 refills | Status: DC
  Filled 2022-06-25: qty 90, 90d supply, fill #0
  Filled 2022-10-11: qty 90, 90d supply, fill #1
  Filled 2023-01-17: qty 90, 90d supply, fill #2

## 2022-06-25 NOTE — Patient Instructions (Addendum)
Let's stop the Lasix and potassium for now. In the next few days if you start having return of symptoms, let me know and go back on them.   Do not miss your appointment with Dr. Lamonte Sakai.   Contact your pharmacy to get the Shingles vaccine and tetanus booster.   Let us know if you need anything.

## 2022-06-25 NOTE — Progress Notes (Signed)
Chief Complaint  Patient presents with   Follow-up    1 month     Subjective: Patient is a 73 y.o. male here for f/u pulm edema. Here w spouse and a friend who helps interpret ([place]).   Seen around 1 mo ago for coughing. CXR showed fluid in lungs. F/u imaging showed a largely nml echo with grade 1 diastolic dysfunction. Improved w Lasix. Still taking daily. No AEs. Wt never really changed. Recent CT chest by pulm largely unremarkable. Has appt in a couple weeks with them. No coughing, CP, SOB, swelling, wt changes.     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 No accessory muscle use Psych: Age appropriate judgment and insight, normal affect and mood  Assessment and Plan: Pulmonary edema without heart failure  Resolved on Lasix, will stop this and K supp. If s/s's return, he will let me know and we will resume.  Tdap/Shingrix rec'd at pharmacy.  F/u in 6 mo for CPE or prn. The patient, spouse thru interpreter voiced understanding and agreement to the plan.  [place], DO 06/25/22  10:39 AM

## 2022-07-10 ENCOUNTER — Encounter: Payer: Self-pay | Admitting: Emergency Medicine

## 2022-07-10 ENCOUNTER — Ambulatory Visit: Payer: Medicare Other | Admitting: Emergency Medicine

## 2022-07-10 VITALS — BP 130/68 | HR 62 | Temp 98.3°F | Ht 64.0 in | Wt 177.0 lb

## 2022-07-10 DIAGNOSIS — J849 Interstitial pulmonary disease, unspecified: Secondary | ICD-10-CM | POA: Diagnosis not present

## 2022-07-10 DIAGNOSIS — R918 Other nonspecific abnormal finding of lung field: Secondary | ICD-10-CM

## 2022-07-10 DIAGNOSIS — F17210 Nicotine dependence, cigarettes, uncomplicated: Secondary | ICD-10-CM | POA: Diagnosis not present

## 2022-07-10 NOTE — Assessment & Plan Note (Signed)
Stable on serial imaging going back to 06/2020.

## 2022-07-10 NOTE — Patient Instructions (Addendum)
We reviewed your CT scan of the chest today.  This is stable compared with your priors going back to November 2022.  Good news. We will plan to repeat a high-resolution CT scan of the chest in March 2025. Congratulations on stopping smoking.  It is very important that you do not restart because cigarette smoke has contributed to the inflammation and scar in your lungs. Follow Dr. Lamonte Sakai in March 2025 or sooner if you have any problems.  ?? ??? ?? CT ??? ??????. ?? 2022? 11? ??? ??? ??????. ?? ?????. 2025? 3??? ??? ???? CT ??? ??? ?????. ??? ??????. ?? ??? ?? ??? ??? ???? ??? ?? ???? ?? ?? ?? ?????. ??? ?? ?? 2025? 3? ?? ? ??? Peter Wong ??? ????.   oneul gwihaui hyungbu CT seukaen-eul geomtohaessseubnida. ineun 2022nyeon 11wol ijeongwa bigyohae anjeongjeog-ibnida. joh-eun sosig-ibnida. 2025nyeon 3wol-eneun hyungbuui gohaesangdo CT seukaen-eul banboghal yejeong-ibnida. geum-yeon-eul chughadeulibnida. dambae yeongiga pyeui yeomjeung-gwa sangcheoleul yubalhaessgi ttaemun-e dasi sijaghaji anhneun geos-i maeu jung-yohabnida. munjega issneun gyeong-u [id number] 3wol ttoneun geu ijeon-e [name] bagsaleul ttaleuseyo

## 2022-07-10 NOTE — Progress Notes (Signed)
   Subjective:    Patient ID: Peter Wong, male    DOB: Jul 21, 1950, 72 y.o.   MRN: [id number]  HPI  ROV 10/19/21 --follow-up visit for 72 year old man with history of prior heavy tobacco use and associated COPD.  Since last time he has restarted smoking. Smoking 7 cig a day. Also with interstitial lung disease with GI and scattered pulmonary nodular disease most consistent with RB-ILD.  Next CT chest due November 2023.  Today he reports that he restarted smoking as above.  He is motivated to cut down.  At his last visit I tried giving him albuterol to use as needed to see if he derived any benefit. He hasn't really used it, denies any SOB. No cough.   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  He is not on a bronchodilator regimen, does have albuterol available to use if needed.  Most recent CT chest was 06/20/2022. He was able to stop smoking since last time! His autoimmune workup was negative in 07/2020.    High-resolution CT scan of the chest 06/20/2022 reviewed by me, shows centrilobular emphysema, patchy mid and lower lung predominant interstitial prominence and some subpleural groundglass change with traction bronchiectasis that is unchanged compared with 02/2021.  Some pulmonary nodules up to 6 mm anterior segment of the right upper lobe also unchanged going back to 06/2020   Review of Systems As per HPI     Objective:   Physical Exam Vitals:   07/10/22 1008  BP: 130/68  Pulse: 62  Temp: 98.3 F (36.8 C)  TempSrc: Oral  SpO2: 96%  Weight: 177 lb (80.3 kg)  Height: 5\' 4"  (1.626 m)   Gen: Pleasant, well-nourished, in no distress,  normal affect  ENT: No lesions,  mouth clear,  oropharynx clear, no postnasal drip  Neck: No JVD, no stridor  Lungs: No use of accessory muscles, few insp basilar crackles.   Cardiovascular: RRR, heart sounds normal, no murmur or gallops, no peripheral  edema  Musculoskeletal: No deformities, no cyanosis or clubbing  Neuro: alert, awake, non focal  Skin: Warm, no lesions or rash      Assessment & Plan:  ILD (interstitial lung disease) ([place]) CT chest stable going back to November 2022.  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  Importantly he has stopped smoking.  I congratulated him on this.  Need to continue to follow him for surveillance.  Next CT scan in 1 year.  Pulmonary nodules Stable on serial imaging going back to 06/2020.  Cigarette nicotine dependence without complication He stopped smoking in January.  Congratulated him on this.  Encouraged him to never restart.  He does not have any dyspnea even though he does have some evidence for emphysematous change and ILD on CT.  We are not on scheduled BD therapy right now.  Baltazar Apo, MD, PhD 07/10/2022, 10:48 AM [facility] [phone] or if no answer before 7:00PM call [phone] For any issues after 7:00PM please call eLink [phone]

## 2022-07-10 NOTE — Assessment & Plan Note (Signed)
He stopped smoking in January.  Congratulated him on this.  Encouraged him to never restart.  He does not have any dyspnea even though he does have some evidence for emphysematous change and ILD on CT.  We are not on scheduled BD therapy right now.

## 2022-07-10 NOTE — Assessment & Plan Note (Signed)
CT chest stable going back to November 2022.  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  Importantly he has stopped smoking.  I congratulated him on this.  Need to continue to follow him for surveillance.  Next CT scan in 1 year.

## 2022-07-10 NOTE — Addendum Note (Signed)
Addended by: Gavin Potters R on: 07/10/2022 11:04 AM   Modules accepted: Orders

## 2022-07-29 ENCOUNTER — Ambulatory Visit (INDEPENDENT_AMBULATORY_CARE_PROVIDER_SITE_OTHER): Payer: Medicare Other | Admitting: Family Medicine

## 2022-07-29 ENCOUNTER — Encounter: Payer: Self-pay | Admitting: Family Medicine

## 2022-07-29 ENCOUNTER — Other Ambulatory Visit (HOSPITAL_BASED_OUTPATIENT_CLINIC_OR_DEPARTMENT_OTHER): Payer: Self-pay

## 2022-07-29 VITALS — BP 130/70 | HR 68 | Temp 97.7°F | Ht 66.0 in | Wt 175.4 lb

## 2022-07-29 DIAGNOSIS — M25512 Pain in left shoulder: Secondary | ICD-10-CM | POA: Diagnosis not present

## 2022-07-29 MED ORDER — MELOXICAM 15 MG PO TABS
15.0000 mg | ORAL_TABLET | Freq: Every day | ORAL | 0 refills | Status: DC
Start: 2022-07-29 — End: 2023-11-04
  Filled 2022-07-29: qty 30, 30d supply, fill #0

## 2022-07-29 NOTE — Progress Notes (Signed)
Musculoskeletal Exam  Patient: Peter Wong DOB: 09-13-1950  DOS: 07/29/2022  SUBJECTIVE:  Chief Complaint:   Chief Complaint  Patient presents with   Shoulder Pain    Right     Peter Wong is a 72 y.o.  male for evaluation and treatment of L shoulder pain. Here w spouse who helps interpret ([place])  Onset:  2 weeks ago. No inj or change in activity.  Location: oter top of L shoulder Character:  sharp, sometimes dull Progression of issue:  is unchanged Associated symptoms: no weakness No decreased ROM, bruising, redness, swelling Treatment: to date has been OTC NSAIDS, acetaminophen, and topicals.   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 No acute distress. Thorax & Lungs: No accessory muscle use Musculoskeletal: L shoulder.   Normal active range of motion: yes.   Normal passive range of motion: yes Tenderness to palpation: over deltoid Deformity: no Ecchymosis: no Tests positive: Empty can Tests negative: Neer's, Hawkins, crossover, liftoff, Neurologic: Normal sensory function. No focal deficits noted. DTR's equal and symmetric in UE's. No clonus.  Grip strength adequate bilaterally Psychiatric: Normal mood. Age appropriate judgment and insight. Alert & oriented x 3.    Assessment:  Acute pain of left shoulder - Plan: meloxicam (MOBIC) 15 MG tablet  Plan: Stretches/exercises, heat, ice, Tylenol. Mobic qd prn. Refer sports med vs PT if no improvement in the next 3-4 weeks.   F/u prn. The patient and his spouse voiced understanding and agreement to the plan.   Jilda Roche [place], DO 07/29/22  3:30 PM

## 2022-07-29 NOTE — Patient Instructions (Addendum)
Heat (pad or rice pillow in microwave) over affected area, 10-15 minutes twice daily.   Ice/cold pack over area for 10-15 min twice daily.  OK to take Tylenol 1000 mg (2 extra strength tabs) or 975 mg (3 regular strength tabs) every 6 hours as needed.  Send message or call in 3-4 weeks if no better.  Let us know if you need anything.  EXERCISES  RANGE OF MOTION (ROM) AND STRETCHING EXERCISES These exercises may help you when beginning to rehabilitate your injury. While completing these exercises, remember:  Restoring tissue flexibility helps normal motion to return to the joints. This allows healthier, less painful movement and activity. An effective stretch should be held for at least 30 seconds. A stretch should never be painful. You should only feel a gentle lengthening or release in the stretched tissue.  ROM - Pendulum Bend at the waist so that your right / left arm falls away from your body. Support yourself with your opposite hand on a solid surface, such as a table or a countertop. Your right / left arm should be perpendicular to the ground. If it is not perpendicular, you need to lean over farther. Relax the muscles in your right / left arm and shoulder as much as possible. Gently sway your hips and trunk so they move your right / left arm without any use of your right / left shoulder muscles. Progress your movements so that your right / left arm moves side to side, then forward and backward, and finally, both clockwise and counterclockwise. Complete 10-15 repetitions in each direction. Many people use this exercise to relieve discomfort in their shoulder as well as to gain range of motion. Repeat 2 times. Complete this exercise 3 times per week.  STRETCH - Flexion, Standing Stand with good posture. With an underhand grip on your right / left hand and an overhand grip on the opposite hand, grasp a broomstick or cane so that your hands are a little more than shoulder-width  apart. Keeping your right / left elbow straight and shoulder muscles relaxed, push the stick with your opposite hand to raise your right / left arm in front of your body and then overhead. Raise your arm until you feel a stretch in your right / left shoulder, but before you have increased shoulder pain. Try to avoid shrugging your right / left shoulder as your arm rises by keeping your shoulder blade tucked down and toward your mid-back spine. Hold 30 seconds. Slowly return to the starting position. Repeat 2 times. Complete this exercise 3 times per week.  STRETCH - Internal Rotation Place your right / left hand behind your back, palm-up. Throw a towel or belt over your opposite shoulder. Grasp the towel/belt with your right / left hand. While keeping an upright posture, gently pull up on the towel/belt until you feel a stretch in the front of your right / left shoulder. Avoid shrugging your right / left shoulder as your arm rises by keeping your shoulder blade tucked down and toward your mid-back spine. Hold 30. Release the stretch by lowering your opposite hand. Repeat 2 times. Complete this exercise 3 times per week.  STRETCH - External Rotation and Abduction Stagger your stance through a doorframe. It does not matter which foot is forward. As instructed by your physician, physical therapist or athletic trainer, place your hands: And forearms above your head and on the door frame. And forearms at head-height and on the door frame. At elbow-height and on  the door frame. Keeping your head and chest upright and your stomach muscles tight to prevent over-extending your low-back, slowly shift your weight onto your front foot until you feel a stretch across your chest and/or in the front of your shoulders. Hold 30 seconds. Shift your weight to your back foot to release the stretch. Repeat 2 times. Complete this stretch 3 times per week.   STRENGTHENING EXERCISES  These exercises may help you  when beginning to rehabilitate your injury. They may resolve your symptoms with or without further involvement from your physician, physical therapist or athletic trainer. While completing these exercises, remember:  Muscles can gain both the endurance and the strength needed for everyday activities through controlled exercises. Complete these exercises as instructed by your physician, physical therapist or athletic trainer. Progress the resistance and repetitions only as guided. You may experience muscle soreness or fatigue, but the pain or discomfort you are trying to eliminate should never worsen during these exercises. If this pain does worsen, stop and make certain you are following the directions exactly. If the pain is still present after adjustments, discontinue the exercise until you can discuss the trouble with your clinician. If advised by your physician, during your recovery, avoid activity or exercises which involve actions that place your right / left hand or elbow above your head or behind your back or head. These positions stress the tissues which are trying to heal.  STRENGTH - Scapular Depression and Adduction With good posture, sit on a firm chair. Supported your arms in front of you with pillows, arm rests or a table top. Have your elbows in line with the sides of your body. Gently draw your shoulder blades down and toward your mid-back spine. Gradually increase the tension without tensing the muscles along the top of your shoulders and the back of your neck. Hold for 3 seconds. Slowly release the tension and relax your muscles completely before completing the next repetition. After you have practiced this exercise, remove the arm support and complete it in standing as well as sitting. Repeat 2 times. Complete this exercise 3 times per week.   STRENGTH - External Rotators Secure a rubber exercise band/tubing to a fixed object so that it is at the same height as your right / left  elbow when you are standing or sitting on a firm surface. Stand or sit so that the secured exercise band/tubing is at your side that is not injured. Bend your elbow 90 degrees. Place a folded towel or small pillow under your right / left arm so that your elbow is a few inches away from your side. Keeping the tension on the exercise band/tubing, pull it away from your body, as if pivoting on your elbow. Be sure to keep your body steady so that the movement is only coming from your shoulder rotating. Hold 3 seconds. Release the tension in a controlled manner as you return to the starting position. Repeat 2 times. Complete this exercise 3 times per week.   STRENGTH - Supraspinatus Stand or sit with good posture. Grasp a 2-3 lb weight or an exercise band/tubing so that your hand is "thumbs-up," like when you shake hands. Slowly lift your right / left hand from your thigh into the air, traveling about 30 degrees from straight out at your side. Lift your hand to shoulder height or as far as you can without increasing any shoulder pain. Initially, many people do not lift their hands above shoulder height. Avoid shrugging your  right / left shoulder as your arm rises by keeping your shoulder blade tucked down and toward your mid-back spine. Hold for 3 seconds. Control the descent of your hand as you slowly return to your starting position. Repeat 2 times. Complete this exercise 3 times per week.   STRENGTH - Shoulder Extensors Secure a rubber exercise band/tubing so that it is at the height of your shoulders when you are either standing or sitting on a firm arm-less chair. With a thumbs-up grip, grasp an end of the band/tubing in each hand. Straighten your elbows and lift your hands straight in front of you at shoulder height. Step back away from the secured end of band/tubing until it becomes tense. Squeezing your shoulder blades together, pull your hands down to the sides of your thighs. Do not allow your  hands to go behind you. Hold for 3 seconds. Slowly ease the tension on the band/tubing as you reverse the directions and return to the starting position. Repeat 2 times. Complete this exercise 3 times per week.   STRENGTH - Scapular Retractors Secure a rubber exercise band/tubing so that it is at the height of your shoulders when you are either standing or sitting on a firm arm-less chair. With a palm-down grip, grasp an end of the band/tubing in each hand. Straighten your elbows and lift your hands straight in front of you at shoulder height. Step back away from the secured end of band/tubing until it becomes tense. Squeezing your shoulder blades together, draw your elbows back as you bend them. Keep your upper arm lifted away from your body throughout the exercise. Hold 3 seconds. Slowly ease the tension on the band/tubing as you reverse the directions and return to the starting position. Repeat 2 times. Complete this exercise 3 times per week.  STRENGTH - Scapular Depressors Find a sturdy chair without wheels, such as a from a dining room table. Keeping your feet on the floor, lift your bottom from the seat and lock your elbows. Keeping your elbows straight, allow gravity to pull your body weight down. Your shoulders will rise toward your ears. Raise your body against gravity by drawing your shoulder blades down your back, shortening the distance between your shoulders and ears. Although your feet should always maintain contact with the floor, your feet should progressively support less body weight as you get stronger. Hold 3 seconds. In a controlled and slow manner, lower your body weight to begin the next repetition. Repeat 2 times. Complete this exercise 3 times per week.    This information is not intended to replace advice given to you by your health care provider. Make sure you discuss any questions you have with your health care provider.   Document Released: 02/06/2005 Document  Revised: 04/15/2014 Document Reviewed: 07/07/2008 Elsevier Interactive Patient Education [employer]

## 2022-09-03 ENCOUNTER — Other Ambulatory Visit (HOSPITAL_BASED_OUTPATIENT_CLINIC_OR_DEPARTMENT_OTHER): Payer: Self-pay

## 2022-09-03 ENCOUNTER — Other Ambulatory Visit: Payer: Self-pay | Admitting: Family Medicine

## 2022-09-03 DIAGNOSIS — E1165 Type 2 diabetes mellitus with hyperglycemia: Secondary | ICD-10-CM

## 2022-09-03 MED ORDER — ATORVASTATIN CALCIUM 80 MG PO TABS
80.0000 mg | ORAL_TABLET | Freq: Every day | ORAL | 2 refills | Status: DC
Start: 2022-09-03 — End: 2023-06-11
  Filled 2022-09-03 (×2): qty 90, 90d supply, fill #0
  Filled 2022-12-05: qty 30, 30d supply, fill #1
  Filled 2022-12-05: qty 60, 60d supply, fill #1
  Filled 2022-12-05: qty 90, 90d supply, fill #1
  Filled 2023-03-10: qty 90, 90d supply, fill #2

## 2022-09-09 ENCOUNTER — Other Ambulatory Visit (HOSPITAL_BASED_OUTPATIENT_CLINIC_OR_DEPARTMENT_OTHER): Payer: Self-pay

## 2022-10-11 ENCOUNTER — Other Ambulatory Visit (HOSPITAL_BASED_OUTPATIENT_CLINIC_OR_DEPARTMENT_OTHER): Payer: Self-pay

## 2022-12-05 ENCOUNTER — Other Ambulatory Visit (HOSPITAL_BASED_OUTPATIENT_CLINIC_OR_DEPARTMENT_OTHER): Payer: Self-pay

## 2022-12-24 ENCOUNTER — Other Ambulatory Visit (HOSPITAL_BASED_OUTPATIENT_CLINIC_OR_DEPARTMENT_OTHER): Payer: Self-pay

## 2022-12-24 MED ORDER — FLUAD 0.5 ML IM SUSY
0.5000 mL | PREFILLED_SYRINGE | Freq: Once | INTRAMUSCULAR | 0 refills | Status: AC
  Filled 2022-12-24: qty 0.5, 1d supply, fill #0

## 2022-12-24 MED ORDER — COMIRNATY 30 MCG/0.3ML IM SUSY
0.3000 mL | PREFILLED_SYRINGE | Freq: Once | INTRAMUSCULAR | 0 refills | Status: AC
  Filled 2022-12-24: qty 0.3, 1d supply, fill #0

## 2023-01-17 ENCOUNTER — Other Ambulatory Visit (HOSPITAL_BASED_OUTPATIENT_CLINIC_OR_DEPARTMENT_OTHER): Payer: Self-pay

## 2023-03-10 ENCOUNTER — Other Ambulatory Visit (HOSPITAL_BASED_OUTPATIENT_CLINIC_OR_DEPARTMENT_OTHER): Payer: Self-pay

## 2023-03-10 MED ORDER — CHLORHEXIDINE GLUCONATE 0.12 % MT SOLN
OROMUCOSAL | 1 refills | Status: DC
  Filled 2023-03-10: qty 473, 16d supply, fill #0

## 2023-03-10 MED ORDER — AMOXICILLIN 500 MG PO CAPS
1000.0000 mg | ORAL_CAPSULE | Freq: Four times a day (QID) | ORAL | 0 refills | Status: DC
  Filled 2023-03-10: qty 40, 5d supply, fill #0

## 2023-03-21 ENCOUNTER — Other Ambulatory Visit (HOSPITAL_BASED_OUTPATIENT_CLINIC_OR_DEPARTMENT_OTHER): Payer: Self-pay

## 2023-04-17 ENCOUNTER — Other Ambulatory Visit: Payer: Self-pay | Admitting: Family Medicine

## 2023-04-17 ENCOUNTER — Other Ambulatory Visit (HOSPITAL_BASED_OUTPATIENT_CLINIC_OR_DEPARTMENT_OTHER): Payer: Self-pay

## 2023-04-17 MED ORDER — METOPROLOL SUCCINATE ER 25 MG PO TB24
25.0000 mg | ORAL_TABLET | Freq: Every day | ORAL | 0 refills | Status: DC
  Filled 2023-04-17: qty 30, 30d supply, fill #0

## 2023-04-30 ENCOUNTER — Ambulatory Visit (INDEPENDENT_AMBULATORY_CARE_PROVIDER_SITE_OTHER): Payer: Medicare Other | Admitting: Family Medicine

## 2023-04-30 ENCOUNTER — Other Ambulatory Visit (HOSPITAL_BASED_OUTPATIENT_CLINIC_OR_DEPARTMENT_OTHER): Payer: Self-pay

## 2023-04-30 ENCOUNTER — Encounter: Payer: Self-pay | Admitting: Family Medicine

## 2023-04-30 VITALS — BP 126/70 | HR 67 | Temp 97.8°F | Resp 16 | Ht 66.0 in | Wt 176.0 lb

## 2023-04-30 DIAGNOSIS — E1165 Type 2 diabetes mellitus with hyperglycemia: Secondary | ICD-10-CM

## 2023-04-30 DIAGNOSIS — Z0000 Encounter for general adult medical examination without abnormal findings: Secondary | ICD-10-CM | POA: Diagnosis not present

## 2023-04-30 LAB — CBC
HCT: 48.7 % (ref 39.0–52.0)
Hemoglobin: 16.4 g/dL (ref 13.0–17.0)
MCHC: 33.7 g/dL (ref 30.0–36.0)
MCV: 93.4 fL (ref 78.0–100.0)
Platelets: 247 10*3/uL (ref 150.0–400.0)
RBC: 5.22 Mil/uL (ref 4.22–5.81)
RDW: 13 % (ref 11.5–15.5)
WBC: 5 10*3/uL (ref 4.0–10.5)

## 2023-04-30 LAB — HEMOGLOBIN A1C: Hgb A1c MFr Bld: 6.6 % — ABNORMAL HIGH (ref 4.6–6.5)

## 2023-04-30 LAB — COMPREHENSIVE METABOLIC PANEL
ALT: 30 U/L (ref 0–53)
AST: 22 U/L (ref 0–37)
Albumin: 4.6 g/dL (ref 3.5–5.2)
Alkaline Phosphatase: 87 U/L (ref 39–117)
BUN: 21 mg/dL (ref 6–23)
CO2: 31 meq/L (ref 19–32)
Calcium: 9.5 mg/dL (ref 8.4–10.5)
Chloride: 103 meq/L (ref 96–112)
Creatinine, Ser: 0.94 mg/dL (ref 0.40–1.50)
GFR: 81.87 mL/min (ref >60.00)
Glucose, Bld: 111 mg/dL — ABNORMAL HIGH (ref 70–99)
Potassium: 4.5 meq/L (ref 3.5–5.1)
Sodium: 138 meq/L (ref 135–145)
Total Bilirubin: 0.9 mg/dL (ref 0.2–1.2)
Total Protein: 7.4 g/dL (ref 6.0–8.3)

## 2023-04-30 LAB — MICROALBUMIN / CREATININE URINE RATIO
Creatinine,U: 148.9 mg/dL
Microalb Creat Ratio: 14.6 mg/g (ref 0.0–30.0)
Microalb, Ur: 21.8 mg/dL — ABNORMAL HIGH (ref 0.0–1.9)

## 2023-04-30 LAB — LIPID PANEL
Cholesterol: 161 mg/dL (ref 0–200)
HDL: 41.6 mg/dL (ref >39.00)
LDL Cholesterol: 81 mg/dL (ref 0–99)
NonHDL: 119.34
Total CHOL/HDL Ratio: 4
Triglycerides: 191 mg/dL — ABNORMAL HIGH (ref 0.0–149.0)
VLDL: 38.2 mg/dL (ref 0.0–40.0)

## 2023-04-30 MED ORDER — METOPROLOL SUCCINATE ER 25 MG PO TB24
25.0000 mg | ORAL_TABLET | Freq: Every day | ORAL | 2 refills | Status: DC
  Filled 2023-04-30 – 2023-05-14 (×3): qty 90, 90d supply, fill #0
  Filled 2023-08-12: qty 90, 90d supply, fill #1

## 2023-04-30 MED ORDER — BOOSTRIX 5-2.5-18.5 LF-MCG/0.5 IM SUSY
0.5000 mL | PREFILLED_SYRINGE | Freq: Once | INTRAMUSCULAR | 0 refills | Status: AC
  Filled 2023-04-30: qty 0.5, 1d supply, fill #0

## 2023-04-30 NOTE — Patient Instructions (Addendum)
Give [place] 2-3 business days to get the results of your labs back.   Keep the diet clean and stay active.  Please consider adding some weight resistance exercise to your routine. Consider yoga as well.   Please schedule your eye exam.   Please schedule your tetanus booster at the pharmacy.  Please get me a copy of your advanced directive form at your convenience.   Please schedule your check up with your cardiologist.  Please stop smoking.  Consider getting the shingles vaccine at the pharmacy as well.   Let us know if you need anything.

## 2023-04-30 NOTE — Progress Notes (Signed)
Chief Complaint  Patient presents with   Annual Exam    Well Male Peter Wong is here for a complete physical.  Here w wife who helps interpret.  His last physical was >1 year ago.  Current diet: in general, a "healthy" diet.   Current exercise: walking, pickle ball Weight trend: stable Fatigue out of ordinary? No. Seat belt? Yes.   Advanced directive? 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 Normal mucous in the whole colon.Internal hemorrhoids. Colonoscopy was otherwise normal   CORONARY ARTERY BYPASS GRAFT  10/2014   ESOPHAGOGASTRODUODENOSCOPY (EGD) WITH PROPOFOL N/A 02/27/2021   Procedure: ESOPHAGOGASTRODUODENOSCOPY (EGD) WITH PROPOFOL;  Surgeon: Lynann Bologna, MD;  Location: [facility];  Service: Endoscopy;  Laterality: N/A;    Medications  Current Outpatient Medications on File Prior to Visit  Medication Sig Dispense Refill   albuterol (VENTOLIN HFA) 108 (90 Base) MCG/ACT inhaler Inhale 2 puffs by mouth into the lungs every 6 (six) hours as needed for wheezing or shortness of breath. 8.5 g 6   aspirin 81 MG chewable tablet Chew 1 tablet by mouth daily.     atorvastatin (LIPITOR) 80 MG tablet Take 1 tablet (80 mg total) by mouth daily. 90 tablet 2   chlorhexidine (PERIDEX) 0.12 % solution Rinse 0.5 oz (15mL) by mouth for 30 seconds then spit, three times daily for 2 weeks. 473 mL 1   icosapent Ethyl (VASCEPA) 1 g capsule Take 2 g  by mouth 2 (two) times daily.     meloxicam (MOBIC) 15 MG tablet Take 1 tablet (15 mg total) by mouth daily. 30 tablet 0   Allergies No Known Allergies  Family History Family History  Problem Relation Age of Onset   Lung cancer Mother    Colon cancer Neg Hx    Esophageal cancer Neg Hx    Colon polyps Neg Hx    Rectal cancer Neg Hx    Stomach cancer Neg Hx     Review of Systems: Constitutional:  no fevers Eye:  no recent significant change in vision Ears:  No changes in hearing Nose/Mouth/Throat:  no complaints of nasal congestion, no sore throat Cardiovascular: no chest pain Respiratory:  No shortness of breath Gastrointestinal:  No change in bowel habits GU:  No frequency Integumentary:  no abnormal skin lesions reported Neurologic:  no headaches Endocrine:  denies unexplained weight changes  Exam BP 126/70   Pulse 67   Temp 97.8 F (36.6 C) (Oral)   Resp 16   Ht 5\' 6"  (1.676 m)   Wt 176 lb (79.8 kg)   SpO2 95%   BMI 28.41 kg/m  General:  well developed, well nourished, in no apparent distress Skin:  no significant moles, warts, or growths on feet or other exposed skin surfaces Head:  no masses, lesions, or tenderness Eyes:  pupils equal and round, sclera anicteric without injection Ears:  canals without lesions,  TMs shiny without retraction, no obvious effusion, no erythema Nose:  nares patent, mucosa normal Throat/Pharynx:  lips and gingiva without lesion; tongue and uvula midline; non-inflamed pharynx; no exudates or postnasal drainage Lungs:  clear to auscultation, breath sounds equal bilaterally, no respiratory distress Cardio:  regular rate and rhythm, no LE edema or bruits, DP pulses 1+ bilaterally Rectal: Deferred GI: BS+, S, NT, ND, no masses or organomegaly Musculoskeletal:  symmetrical muscle groups noted without atrophy or deformity Neuro: Sensation intact to pinprick bilaterally over the feet; Gait normal; deep tendon reflexes normal and  symmetric Psych: well oriented with normal range of affect and appropriate judgment/insight  Assessment and Plan  Well adult exam  Type 2 diabetes mellitus with hyperglycemia, without long-term current use of insulin (HCC) - Plan: CBC, Comprehensive metabolic panel, Lipid panel, Microalbumin / creatinine urine ratio, Hemoglobin A1c   Well 74 y.o. male. Counseled on diet and exercise. Advanced directive form provided today.  Sched eye exam.  Shingrix and Tdap recommended to get at the pharmacy. Other orders as above. Follow up in 6 mo for AWV and DM visit.  The patient voiced understanding and agreement to the plan.  Jilda Roche [place], DO 04/30/23 9:17 AM

## 2023-05-02 ENCOUNTER — Telehealth: Payer: Self-pay

## 2023-05-02 ENCOUNTER — Other Ambulatory Visit (HOSPITAL_BASED_OUTPATIENT_CLINIC_OR_DEPARTMENT_OTHER): Payer: Self-pay

## 2023-05-02 MED ORDER — FENOFIBRATE 48 MG PO TABS
48.0000 mg | ORAL_TABLET | Freq: Every day | ORAL | 3 refills | Status: DC
  Filled 2023-05-02: qty 30, 30d supply, fill #0

## 2023-05-02 NOTE — Telephone Encounter (Signed)
Message sent

## 2023-05-02 NOTE — Telephone Encounter (Signed)
Fenofibrate added to Lipitor. Please schedule a lab visit in 6 weeks and order a lipid and hep function panel. Thank you.

## 2023-05-05 ENCOUNTER — Other Ambulatory Visit: Payer: Self-pay

## 2023-05-05 DIAGNOSIS — E782 Mixed hyperlipidemia: Secondary | ICD-10-CM

## 2023-05-05 NOTE — Telephone Encounter (Signed)
Called pt was and lab appt scheduled.

## 2023-05-07 ENCOUNTER — Telehealth: Payer: Self-pay | Admitting: Family Medicine

## 2023-05-07 NOTE — Telephone Encounter (Unsigned)
Copied from CRM [id number]. Topic: Clinical - Medication Question >> May 07, 2023  8:38 AM Pascal Lux wrote: Reason for CRM: Patient  friend Myriam Jacobson called stated she needs clarity on if patient is taking both [id number]  fenofibrate (TRICOR) 48 MG tablet [[id number]] and [id number]  atorvastatin (LIPITOR) 80 MG tablet [[id number]]  ?  -- Please call [phone]

## 2023-05-09 NOTE — Telephone Encounter (Signed)
Called spoke with Myriam Jacobson pt friend and labs, medication discuss and  Stated understand. We talk on 05/06/2022 but was call to make sure.

## 2023-05-14 ENCOUNTER — Other Ambulatory Visit (HOSPITAL_BASED_OUTPATIENT_CLINIC_OR_DEPARTMENT_OTHER): Payer: Self-pay

## 2023-06-11 ENCOUNTER — Other Ambulatory Visit: Payer: Self-pay | Admitting: Family Medicine

## 2023-06-11 ENCOUNTER — Other Ambulatory Visit (HOSPITAL_BASED_OUTPATIENT_CLINIC_OR_DEPARTMENT_OTHER): Payer: Self-pay

## 2023-06-11 DIAGNOSIS — E1165 Type 2 diabetes mellitus with hyperglycemia: Secondary | ICD-10-CM

## 2023-06-11 MED ORDER — ATORVASTATIN CALCIUM 80 MG PO TABS
80.0000 mg | ORAL_TABLET | Freq: Every day | ORAL | 0 refills | Status: DC
  Filled 2023-06-11: qty 90, 90d supply, fill #0

## 2023-06-18 ENCOUNTER — Other Ambulatory Visit (INDEPENDENT_AMBULATORY_CARE_PROVIDER_SITE_OTHER): Payer: Medicare Other

## 2023-06-18 ENCOUNTER — Encounter: Payer: Self-pay | Admitting: Family Medicine

## 2023-06-18 DIAGNOSIS — E782 Mixed hyperlipidemia: Secondary | ICD-10-CM

## 2023-06-18 LAB — HEPATIC FUNCTION PANEL
ALT: 44 U/L (ref 0–53)
AST: 32 U/L (ref 0–37)
Albumin: 4.5 g/dL (ref 3.5–5.2)
Alkaline Phosphatase: 84 U/L (ref 39–117)
Bilirubin, Direct: 0.2 mg/dL (ref 0.0–0.3)
Total Bilirubin: 1.1 mg/dL (ref 0.2–1.2)
Total Protein: 7 g/dL (ref 6.0–8.3)

## 2023-06-18 LAB — LIPID PANEL
Cholesterol: 155 mg/dL (ref 0–200)
HDL: 43.5 mg/dL (ref >39.00)
LDL Cholesterol: 83 mg/dL (ref 0–99)
NonHDL: 111.18
Total CHOL/HDL Ratio: 4
Triglycerides: 141 mg/dL (ref 0.0–149.0)
VLDL: 28.2 mg/dL (ref 0.0–40.0)

## 2023-06-25 ENCOUNTER — Ambulatory Visit
Admission: RE | Admit: 2023-06-25 | Discharge: 2023-06-25 | Disposition: A | Discharge status: Home / Self Care | Payer: Medicare Other | Source: Ambulatory Visit | Attending: Emergency Medicine | Admitting: Emergency Medicine

## 2023-06-25 DIAGNOSIS — J849 Interstitial pulmonary disease, unspecified: Secondary | ICD-10-CM

## 2023-07-18 ENCOUNTER — Other Ambulatory Visit (HOSPITAL_BASED_OUTPATIENT_CLINIC_OR_DEPARTMENT_OTHER): Payer: Self-pay

## 2023-07-18 MED ORDER — ZOSTER VAC RECOMB ADJUVANTED 50 MCG/0.5ML IM SUSR
0.5000 mL | Freq: Once | INTRAMUSCULAR | 0 refills | Status: AC
Start: 2023-07-18 — End: 2023-07-19
  Filled 2023-07-18: qty 0.5, 1d supply, fill #0

## 2023-07-22 ENCOUNTER — Telehealth: Payer: Self-pay | Admitting: Emergency Medicine

## 2023-07-22 NOTE — Telephone Encounter (Signed)
 Copied from CRM [id number]. Topic: Clinical - Lab/Test Results >> Jul 22, 2023  8:27 AM Isabell A wrote: Reason for CRM: Collie Days (friend on DPR) would like to speak with a nurse in regard to discuss CT results from last month.  Callback number: [phone]

## 2023-07-22 NOTE — Telephone Encounter (Signed)
Dr. Byrum, please advise. 

## 2023-07-23 NOTE — Telephone Encounter (Signed)
 Discussed CT results with Collie Days, shows stable changes consistent with emphysema, RB-ILD, pulmonary micronodular disease.  All unchanged going back to 2022.  Patient will make a follow-up visit with me this year to review his status.

## 2023-08-12 ENCOUNTER — Other Ambulatory Visit (HOSPITAL_BASED_OUTPATIENT_CLINIC_OR_DEPARTMENT_OTHER): Payer: Self-pay

## 2023-09-06 DIAGNOSIS — R21 Rash and other nonspecific skin eruption: Secondary | ICD-10-CM | POA: Diagnosis not present

## 2023-09-06 DIAGNOSIS — L500 Allergic urticaria: Secondary | ICD-10-CM | POA: Diagnosis not present

## 2023-09-15 ENCOUNTER — Other Ambulatory Visit (HOSPITAL_BASED_OUTPATIENT_CLINIC_OR_DEPARTMENT_OTHER): Payer: Self-pay

## 2023-09-15 ENCOUNTER — Other Ambulatory Visit: Payer: Self-pay | Admitting: Family Medicine

## 2023-09-15 DIAGNOSIS — E1165 Type 2 diabetes mellitus with hyperglycemia: Secondary | ICD-10-CM

## 2023-09-15 MED ORDER — ATORVASTATIN CALCIUM 80 MG PO TABS
80.0000 mg | ORAL_TABLET | Freq: Every day | ORAL | 0 refills | Status: DC
  Filled 2023-09-15: qty 30, 30d supply, fill #0

## 2023-10-13 ENCOUNTER — Other Ambulatory Visit (HOSPITAL_BASED_OUTPATIENT_CLINIC_OR_DEPARTMENT_OTHER): Payer: Self-pay

## 2023-10-13 ENCOUNTER — Other Ambulatory Visit: Payer: Self-pay | Admitting: Family Medicine

## 2023-10-13 DIAGNOSIS — E1165 Type 2 diabetes mellitus with hyperglycemia: Secondary | ICD-10-CM

## 2023-10-13 MED ORDER — ZOSTER VAC RECOMB ADJUVANTED 50 MCG/0.5ML IM SUSR
0.5000 mL | Freq: Once | INTRAMUSCULAR | 0 refills | Status: AC
  Filled 2023-10-13: qty 0.5, 1d supply, fill #0

## 2023-10-13 MED ORDER — ATORVASTATIN CALCIUM 80 MG PO TABS
80.0000 mg | ORAL_TABLET | Freq: Every day | ORAL | 0 refills | Status: DC
  Filled 2023-10-13: qty 30, 30d supply, fill #0

## 2023-10-21 DIAGNOSIS — H02831 Dermatochalasis of right upper eyelid: Secondary | ICD-10-CM | POA: Diagnosis not present

## 2023-10-21 DIAGNOSIS — H02834 Dermatochalasis of left upper eyelid: Secondary | ICD-10-CM | POA: Diagnosis not present

## 2023-10-30 ENCOUNTER — Telehealth: Payer: Self-pay

## 2023-10-30 NOTE — Telephone Encounter (Signed)
   Name: Peter Wong  DOB: 12-28-1950  MRN: [id number]  Primary Cardiologist: Redell Leiter, MD  Chart reviewed as part of pre-operative protocol coverage. Because of Tyan Vasil's past medical history and time since last visit, he will require a follow-up in-office visit in order to better assess preoperative cardiovascular risk.  Pre-op covering staff: - Please schedule appointment and call patient to inform them. If patient already had an upcoming appointment within acceptable timeframe, please add pre-op clearance to the appointment notes so provider is aware. - Please contact requesting surgeon's office via preferred method (i.e, phone, fax) to inform them of need for appointment prior to surgery.  Ideally aspirin should be continued without interruption, however if the bleeding risk is too great, aspirin may be held for 5-7 days prior to surgery. Please resume aspirin post operatively when it is felt to be safe from a bleeding standpoint.    Lum LITTIE Louis, NP  10/30/2023, 3:45 PM

## 2023-10-30 NOTE — Telephone Encounter (Signed)
   Pre-operative Risk Assessment    Patient Name: Peter Wong  DOB: [birth year] MRN: [id number]   Date of last office visit: 07/11/2021 Date of next office visit: None   Request for Surgical Clearance    Procedure:  Bilateral Upper Lid Blepharoplasty  Date of Surgery:  Clearance TBD                                 Surgeon:  Dr. Ozell Hock  Surgeon's Group or Practice Name:  [facility]  Phone number:  [phone] Fax number:  [phone]   Type of Clearance Requested:   - Pharmacy:  Hold Aspirin Not specified   Type of Anesthesia:  Not Indicated   Additional requests/questions:    Signed, Kaesen Rodriguez M Atha Muradyan   10/30/2023, 1:08 PM

## 2023-10-30 NOTE — Telephone Encounter (Signed)
 Left message for the pt to call our office and scheduled IN OFFICE Preop appt (Using Language [employer] Interpreter - [place])

## 2023-10-31 NOTE — Telephone Encounter (Signed)
 Pt. Scheduled for 11/24/23 with Josefa Beauvais, NP.

## 2023-11-04 ENCOUNTER — Other Ambulatory Visit (HOSPITAL_BASED_OUTPATIENT_CLINIC_OR_DEPARTMENT_OTHER): Payer: Self-pay

## 2023-11-04 ENCOUNTER — Ambulatory Visit: Payer: Self-pay | Admitting: Family Medicine

## 2023-11-04 ENCOUNTER — Ambulatory Visit (INDEPENDENT_AMBULATORY_CARE_PROVIDER_SITE_OTHER): Admitting: Family Medicine

## 2023-11-04 ENCOUNTER — Other Ambulatory Visit: Payer: Self-pay

## 2023-11-04 ENCOUNTER — Encounter: Payer: Self-pay | Admitting: Family Medicine

## 2023-11-04 VITALS — BP 122/76 | HR 56 | Temp 97.9°F | Resp 16 | Ht 66.0 in | Wt 177.0 lb

## 2023-11-04 DIAGNOSIS — E782 Mixed hyperlipidemia: Secondary | ICD-10-CM

## 2023-11-04 DIAGNOSIS — E1165 Type 2 diabetes mellitus with hyperglycemia: Secondary | ICD-10-CM | POA: Diagnosis not present

## 2023-11-04 LAB — LIPID PANEL
Cholesterol: 133 mg/dL (ref 0–200)
HDL: 35.8 mg/dL — ABNORMAL LOW (ref >39.00)
LDL Cholesterol: 36 mg/dL (ref 0–99)
NonHDL: 97.51
Total CHOL/HDL Ratio: 4
Triglycerides: 306 mg/dL — ABNORMAL HIGH (ref 0.0–149.0)
VLDL: 61.2 mg/dL — ABNORMAL HIGH (ref 0.0–40.0)

## 2023-11-04 LAB — COMPREHENSIVE METABOLIC PANEL WITH GFR
ALT: 34 U/L (ref 0–53)
AST: 26 U/L (ref 0–37)
Albumin: 4.3 g/dL (ref 3.5–5.2)
Alkaline Phosphatase: 98 U/L (ref 39–117)
BUN: 17 mg/dL (ref 6–23)
CO2: 28 meq/L (ref 19–32)
Calcium: 8.8 mg/dL (ref 8.4–10.5)
Chloride: 105 meq/L (ref 96–112)
Creatinine, Ser: 1.06 mg/dL (ref 0.40–1.50)
GFR: 70.63 mL/min (ref >60.00)
Glucose, Bld: 127 mg/dL — ABNORMAL HIGH (ref 70–99)
Potassium: 4.2 meq/L (ref 3.5–5.1)
Sodium: 141 meq/L (ref 135–145)
Total Bilirubin: 0.6 mg/dL (ref 0.2–1.2)
Total Protein: 6.7 g/dL (ref 6.0–8.3)

## 2023-11-04 LAB — HEMOGLOBIN A1C: Hgb A1c MFr Bld: 6.7 % — ABNORMAL HIGH (ref 4.6–6.5)

## 2023-11-04 MED ORDER — METOPROLOL SUCCINATE ER 25 MG PO TB24
25.0000 mg | ORAL_TABLET | Freq: Every day | ORAL | 3 refills | Status: DC
  Filled 2023-11-04: qty 90, 90d supply, fill #0
  Filled 2024-03-02: qty 90, 90d supply, fill #1

## 2023-11-04 MED ORDER — ATORVASTATIN CALCIUM 80 MG PO TABS
80.0000 mg | ORAL_TABLET | Freq: Every day | ORAL | 3 refills | Status: DC
  Filled 2023-11-04 – 2023-11-05 (×2): qty 90, 90d supply, fill #0
  Filled 2024-03-02: qty 90, 90d supply, fill #1

## 2023-11-04 MED ORDER — FENOFIBRATE 48 MG PO TABS
48.0000 mg | ORAL_TABLET | Freq: Every day | ORAL | 3 refills | Status: DC
  Filled 2023-11-04: qty 90, 90d supply, fill #0

## 2023-11-04 MED ORDER — AZITHROMYCIN 500 MG PO TABS
500.0000 mg | ORAL_TABLET | Freq: Every day | ORAL | 0 refills | Status: DC
Start: 2023-11-04 — End: 2023-12-05
  Filled 2023-11-04: qty 3, 3d supply, fill #0

## 2023-11-04 NOTE — Progress Notes (Signed)
 Subjective:   Chief Complaint  Patient presents with   Follow-up    Peter Wong is a 73 y.o. male here for follow-up of diabetes.   Peter Wong does not routinely check his sugars.  Patient does not require insulin.   Medications include: diet controlled Diet is fair.  Exercise: walking  Mixed Hyperlipidemia Patient presents for mixed hyperlipidemia follow up. Currently being treated with Lipitor 80 mg/d, Tricor  48 mg/d and compliance with treatment thus far has been good. He denies myalgias. Diet/exercise as above. No CP or SOB.  The patient is known to have coexisting coronary artery disease.  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)    Diabetes mellitus without complication (HCC)    on meds   Hyperlipidemia    on meds   Hypertension    on meds     Related testing: Retinal exam: Done Pneumovax: done  Objective:  BP 122/76 (BP Location: Left Arm, Patient Position: Sitting)   Pulse (!) 56   Temp 97.9 F (36.6 C) (Oral)   Resp 16   Ht 5' 6 (1.676 m)   Wt 177 lb (80.3 kg)   SpO2 95%   BMI 28.57 kg/m  General:  Well developed, well nourished, in no apparent distress Lungs:  CTAB, no access msc use Cardio:  Reg rhythm, bradycardic, no bruits, no LE edema Psych: Age appropriate judgment and insight  Assessment:   Type 2 diabetes mellitus with hyperglycemia, without long-term current use of insulin (HCC) - Plan: Comprehensive metabolic panel with GFR, Lipid panel, Hemoglobin A1c, Microalbumin / creatinine urine ratio, atorvastatin  (LIPITOR) 80 MG tablet  Mixed hyperlipidemia - Plan: atorvastatin  (LIPITOR) 80 MG tablet, fenofibrate  (TRICOR ) 48 MG tablet   Plan:   Chronic, stable. Cont diet control. Counseled on diet and exercise. Chronic, stable. Cont Lipitor 80 mg/d, Tricor  48 mg/d.  F/u in 6 mo. The patient voiced understanding and agreement to the plan.  Peter Wong  [place], DO 11/04/23 1:54 PM

## 2023-11-04 NOTE — Patient Instructions (Signed)
 Give [place] 2-3 business days to get the results of your labs back.   Keep the diet clean and stay active.  Let us know if you need anything.

## 2023-11-05 ENCOUNTER — Other Ambulatory Visit: Payer: Self-pay

## 2023-11-05 ENCOUNTER — Other Ambulatory Visit (HOSPITAL_BASED_OUTPATIENT_CLINIC_OR_DEPARTMENT_OTHER): Payer: Self-pay

## 2023-11-05 DIAGNOSIS — E1165 Type 2 diabetes mellitus with hyperglycemia: Secondary | ICD-10-CM

## 2023-11-05 LAB — MICROALBUMIN / CREATININE URINE RATIO
Creatinine,U: 126 mg/dL
Microalb Creat Ratio: 90.3 mg/g — ABNORMAL HIGH (ref 0.0–30.0)
Microalb, Ur: 11.4 mg/dL — ABNORMAL HIGH (ref 0.0–1.9)

## 2023-11-06 ENCOUNTER — Other Ambulatory Visit (HOSPITAL_BASED_OUTPATIENT_CLINIC_OR_DEPARTMENT_OTHER): Payer: Self-pay

## 2023-11-07 ENCOUNTER — Other Ambulatory Visit (HOSPITAL_BASED_OUTPATIENT_CLINIC_OR_DEPARTMENT_OTHER): Payer: Self-pay

## 2023-11-07 ENCOUNTER — Other Ambulatory Visit: Payer: Self-pay

## 2023-11-07 MED ORDER — METHYLPREDNISOLONE 4 MG PO TBPK
ORAL_TABLET | ORAL | 0 refills | Status: AC
  Filled 2023-11-07: qty 21, 6d supply, fill #0

## 2023-11-21 ENCOUNTER — Other Ambulatory Visit (HOSPITAL_BASED_OUTPATIENT_CLINIC_OR_DEPARTMENT_OTHER): Payer: Self-pay

## 2023-11-21 NOTE — Progress Notes (Unsigned)
 Cardiology Clinic Note   Patient Name: Peter Wong Date of Encounter: 11/24/2023  Primary Care Provider:  Frann [name], DO Primary Cardiologist:  Redell Leiter, MD  Patient Profile    Aadin Bucker 73 year old male presents to the clinic today for follow-up evaluation of his hypertension, coronary artery disease, and hyperlipidemia.  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 Normal mucous in the whole colon.Internal hemorrhoids. Colonoscopy was otherwise normal   CORONARY ARTERY BYPASS GRAFT  10/2014   ESOPHAGOGASTRODUODENOSCOPY (EGD) WITH PROPOFOL  N/A 02/27/2021   Procedure: ESOPHAGOGASTRODUODENOSCOPY (EGD) WITH PROPOFOL ;  Surgeon: Charlanne Groom, MD;  Location: [facility];  Service: Endoscopy;  Laterality: N/A;    Allergies  No Known Allergies  History of Present Illness    Clayburn Granville has a PMH of coronary artery disease, status post CABG ([facility] 2016 LIMA-LAD, SVG-diagonal, SVG to obtuse marginal, and SVG to RCA.), HTN, HLD, and prediabetes.  Echocardiogram 2024 showed LVEF of 60 to 65%, G1 DD, and no significant valvular abnormalities.  He was seen in follow-up by Dr. Bernie 07/10/2021.  During that time he continued to smoke.  He was being seen for preoperative cardiac evaluation for eye surgery.  He asked about stopping his aspirin.  From a cardiac standpoint he was doing well.  He was very active and able to walk stairs without difficulty.  He was smoking about 1/3 pack/day.  It was felt  that his surgery risk was low.  His blood pressure was well-controlled.  He presents to the clinic today for follow-up evaluation and preoperative cardiac evaluation.  He presents with his wife who acts as interpreter.  We reviewed his previous open heart surgery.  She asks about the possibility of having MRI for further lung evaluation in [place].  In my review I do not feel that he has any metal that would disqualify him from MRI.  We reviewed this.  His previous echocardiogram 2024 showed normal LVEF and G1 DD.  We reviewed his previous clinic visit with Dr. Krasowski.  He reports that he is able to walk with a walking group and do [place].  He does this without difficulty.  I will continue his current medication regimen, have him maintain his physical activity, give salty 6 diet sheet and also give smoking cessation information.  We will plan follow-up in 12 months.  Today he denies chest pain, shortness of breath, lower extremity edema, fatigue, palpitations, melena, hematuria, hemoptysis, diaphoresis, weakness, presyncope, syncope, orthopnea, and PND.    Home Medications    Prior to Admission medications   Medication Sig Start Date End Date Taking? Authorizing Provider  albuterol  (VENTOLIN  HFA) 108 (90 Base) MCG/ACT inhaler Inhale 2 puffs by mouth into the lungs every 6 (six) hours as needed for wheezing or shortness of breath. 04/11/21   Byrum, Robert S, MD  aspirin 81 MG chewable tablet Chew 1 tablet by mouth daily. 11/11/14   [provider]  atorvastatin  (LIPITOR) 80 MG tablet Take 1 tablet (80 mg total) by mouth daily. 11/04/23   Frann [name], DO  azithromycin  (ZITHROMAX ) 500 MG tablet Take 1 tablet (500 mg total) by mouth daily in event of traveler's diarrhea or pneumonia. 11/04/23   Frann [name], DO  fenofibrate  (TRICOR ) 48 MG tablet Take 1 tablet (48 mg total) by mouth daily. 11/04/23   Frann [name], DO  icosapent Ethyl (VASCEPA) 1 g  capsule Take 2 g by mouth 2 (two) times daily.    [provider]  methylPREDNISolone  (MEDROL  DOSEPAK) 4 MG TBPK tablet Follow instructions on package; Use as directed. 11/07/23   Frann [name], DO  metoprolol  succinate (TOPROL -XL) 25 MG 24 hr tablet Take 1 tablet (25 mg total) by mouth daily. 11/04/23   Frann [name], DO    Family History    Family History  Problem Relation Age of Onset   Lung cancer Mother    Colon cancer Neg Hx    Esophageal cancer Neg Hx    Colon polyps Neg Hx    Rectal cancer Neg Hx    Stomach cancer Neg Hx    He indicated that his mother is deceased. He indicated that the status of his neg hx is unknown.  Social History    Social History   Socioeconomic History   Marital status: Married    Spouse name: Not on file   Number of children: Not on file   Years of education: Not on file   Highest education level: Not on file  Occupational History   Not on file  Tobacco Use   Smoking status: Former    Current packs/day: 0.00    Average packs/day: 1 pack/day for 40.0 years (40.0 ttl pk-yrs)    Types: Cigarettes    Start date: 04/29/1976    Quit date: 04/29/2016    Years since quitting: 7.5   Smokeless tobacco: Never   Tobacco comments:    Pt hasn't smoked since end of year  Vaping Use   Vaping status: Never Used  Substance and Sexual Activity   Alcohol use: Not Currently   Drug use: Never   Sexual activity: Not Currently  Other Topics Concern   Not on file  Social History Narrative   Not on file   Social Drivers of Health   Financial Resource Strain: Not on file  Food Insecurity: Not on file  Transportation Needs: Not on file  Physical Activity: Not on file  Stress: Not on file  Social Connections: Unknown (08/20/2021)   [facility]   Social Network    Social Network: Not on file  Intimate Partner Violence: Unknown (07/12/2021)   Received from [facility]   HITS    Physically Hurt: Not on file     Insult or Talk Down To: Not on file    Threaten Physical Harm: Not on file    Scream or Curse: Not on file     Review of Systems    General:  No chills, fever, night sweats or weight changes.  Cardiovascular:  No chest pain, dyspnea on exertion, edema, orthopnea, palpitations, paroxysmal nocturnal dyspnea. Dermatological: No rash, lesions/masses Respiratory: No cough, dyspnea Urologic: No hematuria, dysuria Abdominal:   No nausea, vomiting, diarrhea, bright red blood per rectum, melena, or hematemesis Neurologic:  No visual changes, wkns, changes in mental status. All other systems reviewed and are otherwise negative except as noted above.  Physical Exam    VS:  BP 138/70 (BP Location: Right Arm, Patient Position: Sitting, Cuff Size: Normal)   Pulse (!) 56   Ht 5' 7 (1.702 m)   Wt 174 lb (78.9 kg)   SpO2 97%   BMI 27.25 kg/m  , BMI Body mass index is 27.25 kg/m. GEN: Well nourished, well developed, in no acute distress. HEENT: normal. Neck: Supple, no JVD, carotid bruits, or masses. Cardiac: RRR, no murmurs, rubs, or gallops. No clubbing, cyanosis, edema.  Radials/DP/PT 2+ and equal bilaterally.  Respiratory:  Respirations regular and unlabored, clear to auscultation bilaterally. GI: Soft, nontender, nondistended, BS + x 4. MS: no deformity or atrophy. Skin: warm and dry, no rash. Neuro:  Strength and sensation are intact. Psych: Normal affect.  Accessory Clinical Findings    Recent Labs: 04/30/2023: Hemoglobin 16.4; Platelets 247.0 11/04/2023: ALT 34; BUN 17; Creatinine, Ser 1.06; Potassium 4.2; Sodium 141   Recent Lipid Panel    Component Value Date/Time   CHOL 133 11/04/2023 1312   TRIG 306.0 (H) 11/04/2023 1312   HDL 35.80 (L) 11/04/2023 1312   CHOLHDL 4 11/04/2023 1312   VLDL 61.2 (H) 11/04/2023 1312   LDLCALC 36 11/04/2023 1312   LDLDIRECT 92.0 09/25/2021 0822         ECG personally reviewed by me today- EKG Interpretation Date/Time:  Monday November 24 2023 08:54:54 EDT Ventricular Rate:  56 PR Interval:  234 QRS Duration:  90 QT Interval:  384 QTC Calculation: 370 R Axis:   -24  Text Interpretation: Sinus bradycardia with 1st degree A-V block Minimal voltage criteria for LVH, may be normal variant ( R in aVL ) When compared with ECG of 04-Jun-2018 10:06, PREVIOUS ECG IS PRESENT Confirmed by Emelia Hazy [id number]) on 11/24/2023 8:58:12 AM   Echocardiogram 06/18/2022  IMPRESSIONS     1. GLS -18.3. Left ventricular ejection fraction, by estimation, is 60 to  65%. Left ventricular ejection fraction by 3D volume is 59 %. The left  ventricle has normal function. The left ventricle has no regional wall  motion abnormalities. There is mild  left ventricular hypertrophy. Left ventricular diastolic parameters are  consistent with Grade I diastolic dysfunction (impaired relaxation).   2. Right ventricular systolic function is normal. The right ventricular  size is normal. There is normal pulmonary artery systolic pressure.   3. The mitral valve is normal in structure. Mild mitral valve  regurgitation. No evidence of mitral stenosis.   4. The aortic valve is normal in structure. Aortic valve regurgitation is  not visualized. No aortic stenosis is present.   5. The inferior vena cava is normal in size with greater than 50%  respiratory variability, suggesting right atrial pressure of 3 mmHg.   FINDINGS   Left Ventricle: GLS -18.3. Left ventricular ejection fraction, by  estimation, is 60 to 65%. Left ventricular ejection fraction by 3D volume  is 59 %. The left ventricle has normal function. The left ventricle has no  regional wall motion abnormalities.  The left ventricular internal cavity size was normal in size. There is  mild left ventricular hypertrophy. Left ventricular diastolic parameters  are consistent with Grade I diastolic dysfunction (impaired relaxation).   Right Ventricle: The right ventricular size is normal. No increase in   right ventricular wall thickness. Right ventricular systolic function is  normal. There is normal pulmonary artery systolic pressure. The tricuspid  regurgitant velocity is 2.71 m/s, and   with an assumed right atrial pressure of 3 mmHg, the estimated right  ventricular systolic  pressure is 32.4 mmHg.   Left Atrium: Left atrial size was normal in size.   Right Atrium: Right atrial size was normal in size.   Pericardium: There is no evidence of pericardial effusion.   Mitral Valve: The mitral valve is normal in structure. Mild mitral valve  regurgitation. No evidence of mitral valve stenosis.   Tricuspid Valve: The tricuspid valve is normal in structure. Tricuspid  valve regurgitation is mild . No evidence of tricuspid stenosis.   Aortic Valve: The aortic valve is normal in structure. Aortic valve  regurgitation is not visualized. No aortic stenosis is present.   Pulmonic Valve: The pulmonic valve was normal in structure. Pulmonic valve  regurgitation is not visualized. No evidence of pulmonic stenosis.   Aorta: The aortic root is normal in size and structure.   Venous: The inferior vena cava is normal in size with greater than 50%  respiratory variability, suggesting right atrial pressure of 3 mmHg.   IAS/Shunts: No atrial level shunt detected by color flow Doppler.       Assessment & Plan   1.  Coronary artery disease-no chest pain today.  Denies recent episodes of exertional chest discomfort.  Continues to be fairly physically active.  No formal exercise routine.  Status post CABG [facility]. Heart healthy low-sodium diet Maintain physical activity Continue aspirin, atorvastatin , fenofibrate , metoprolol   Hyperlipidemia-LDL 36 on 11/04/2023. High-fiber diet Continue aspirin, atorvastatin , fenofibrate   Essential hypertension-BP today 138/70. Maintain blood pressure log Low-sodium diet-salty 6 diet sheet given Continue metoprolol   Preoperative  cardiac evaluation- Bilateral Upper Lid Blepharoplasty   Date of Surgery:  Clearance TBD                                  Surgeon:  Dr. Ozell Hock  Surgeon's Group or Practice Name:  [facility]  Phone number:  [phone] Fax number:  [phone]  Primary Cardiologist: Redell Leiter, MD  Chart reviewed as part of pre-operative protocol coverage. Given past medical history and time since last visit, based on ACC/AHA guidelines, Gjon Kady would be at acceptable risk for the planned procedure without further cardiovascular testing.   His RCRI is low risk, 0.9% risk of major cardiac event.  He is able to complete greater than 4 METS of physical activity.  Patient was advised that if he develops new symptoms prior to surgery to contact our office to arrange a follow-up appointment.  He verbalized understanding.  Ideally aspirin should be continued without interruption, however if the bleeding risk is too great, aspirin may be held for 5-7 days prior to surgery. Please resume aspirin post operatively when it is felt to be safe from a bleeding standpoint.   I will route this recommendation to the requesting party via Epic fax function and remove from pre-op pool.   Disposition: Follow-up with Dr. Leiter or me in 9-12 months.   Josefa HERO. Shemeca Lukasik NP-C     11/24/2023, 9:19 AM [facility] [address] [place], [place] [address] Office [phone]      I spent 14 minutes examining this patient, reviewing medications, and using patient centered shared decision making involving their cardiac care.   I spent  20 minutes reviewing past medical history,  medications, and prior cardiac tests.

## 2023-11-24 ENCOUNTER — Encounter: Payer: Self-pay | Admitting: General Practice

## 2023-11-24 ENCOUNTER — Ambulatory Visit: Attending: Cardiovascular Disease | Admitting: General Practice

## 2023-11-24 VITALS — BP 138/70 | HR 56 | Ht 67.0 in | Wt 174.0 lb

## 2023-11-24 DIAGNOSIS — Z01810 Encounter for preprocedural cardiovascular examination: Secondary | ICD-10-CM | POA: Diagnosis not present

## 2023-11-24 DIAGNOSIS — I10 Essential (primary) hypertension: Secondary | ICD-10-CM

## 2023-11-24 DIAGNOSIS — I2510 Atherosclerotic heart disease of native coronary artery without angina pectoris: Secondary | ICD-10-CM

## 2023-11-24 DIAGNOSIS — E782 Mixed hyperlipidemia: Secondary | ICD-10-CM | POA: Diagnosis not present

## 2023-11-24 NOTE — Patient Instructions (Signed)
 Medication Instructions:  Your physician recommends that you continue on your current medications as directed. Please refer to the Current Medication list given to you today.  *If you need a refill on your cardiac medications before your next appointment, please call your pharmacy*  Lab Work: NONE If you have labs (blood work) drawn today and your tests are completely normal, you will receive your results only by: MyChart Message (if you have MyChart) OR A paper copy in the mail If you have any lab test that is abnormal or we need to change your treatment, we will call you to review the results.  Testing/Procedures: NONE  Follow-Up: At [facility], you and your health needs are our priority.  As part of our continuing mission to provide you with exceptional heart care, our providers are all part of one team.  This team includes your primary Cardiologist (physician) and Advanced Practice Providers or APPs (Physician Assistants and Nurse Practitioners) who all work together to provide you with the care you need, when you need it.  Your next appointment:   1 year(s)  Provider:   Redell Leiter, MD  or Josefa Beauvais, NP  We recommend signing up for the patient portal called MyChart.  Sign up information is provided on this After Visit Summary.  MyChart is used to connect with patients for Virtual Visits (Telemedicine).  Patients are able to view lab/test results, encounter notes, upcoming appointments, etc.  Non-urgent messages can be sent to your provider as well.   To learn more about what you can do with MyChart, go to [email or web address].   Other Instructions Exercise regularly as told by your doctor. Make sure to weight daily and keep a weight log.   Moderate-intensity exercise is any activity that gets you moving enough to burn at least three times more energy (calories) than if you were sitting. Examples of moderate exercise include: Walking a mile in 15  minutes. Doing light yard work. Biking at an easy pace. Most people should get at least 150 minutes of moderate-intensity exercise a week to maintain their body weight.  Increase your water intake: Maintain hydration     Managing the Challenge of Quitting Smoking Quitting smoking is a physical and mental challenge. You may have cravings, withdrawal symptoms, and temptation to smoke. Before quitting, work with your health care provider to make a plan that can help you manage quitting. Making a plan before you quit may keep you from smoking when you have the urge to smoke while trying to quit. How to manage lifestyle changes Managing stress Stress can make you want to smoke, and wanting to smoke may cause stress. It is important to find ways to manage your stress. You could try some of the following: Practice relaxation techniques. Breathe slowly and deeply, in through your nose and out through your mouth. Listen to music. Soak in a bath or take a shower. Imagine a peaceful place or vacation. Get some support. Talk with family or friends about your stress. Join a support group. Talk with a counselor or therapist. Get some physical activity. Go for a walk, run, or bike ride. Play a favorite sport. Practice yoga.  Medicines Talk with your health care provider about medicines that might help you deal with cravings and make quitting easier for you. Relationships Social situations can be difficult when you are quitting smoking. To manage this, you can: Avoid parties and other social situations where people might be smoking. Avoid alcohol. Leave right away if  you have the urge to smoke. Explain to your family and friends that you are quitting smoking. Ask for support and let them know you might be a bit grumpy. Plan activities where smoking is not an option. General instructions Be aware that many people gain weight after they quit smoking. However, not everyone does. To keep from  gaining weight, have a plan in place before you quit, and stick to the plan after you quit. Your plan should include: Eating healthy snacks. When you have a craving, it may help to: Eat popcorn, or try carrots, celery, or other cut vegetables. Chew sugar-free gum. Changing how you eat. Eat small portion sizes at meals. Eat 4-6 small meals throughout the day instead of 1-2 large meals a day. Be mindful when you eat. You should avoid watching television or doing other things that might distract you as you eat. Exercising regularly. Make time to exercise each day. If you do not have time for a long workout, do short bouts of exercise for 5-10 minutes several times a day. Do some form of strengthening exercise, such as weight lifting. Do some exercise that gets your heart beating and causes you to breathe deeply, such as walking fast, running, swimming, or biking. This is very important. Drinking plenty of water or other low-calorie or no-calorie drinks. Drink enough fluid to keep your urine pale yellow.  How to recognize withdrawal symptoms Your body and mind may experience discomfort as you try to get used to not having nicotine  in your system. These effects are called withdrawal symptoms. They may include: Feeling hungrier than normal. Having trouble concentrating. Feeling irritable or restless. Having trouble sleeping. Feeling depressed. Craving a cigarette. These symptoms may surprise you, but they are normal to have when quitting smoking. To manage withdrawal symptoms: Avoid places, people, and activities that trigger your cravings. Remember why you want to quit. Get plenty of sleep. Avoid coffee and other drinks that contain caffeine. These may worsen some of your symptoms. How to manage cravings Come up with a plan for how to deal with your cravings. The plan should include the following: A definition of the specific situation you want to deal with. An activity or action you will  take to replace smoking. A clear idea for how this action will help. The name of someone who could help you with this. Cravings usually last for 5-10 minutes. Consider taking the following actions to help you with your plan to deal with cravings: Keep your mouth busy. Chew sugar-free gum. Suck on hard candies or a straw. Brush your teeth. Keep your hands and body busy. Change to a different activity right away. Squeeze or play with a ball. Do an activity or a hobby, such as making bead jewelry, practicing needlepoint, or working with wood. Mix up your normal routine. Take a short exercise break. Go for a quick walk, or run up and down stairs. Focus on doing something kind or helpful for someone else. Call a friend or family member to talk during a craving. Join a support group. Contact a quitline. Where to find support To get help or find a support group: Call the National Cancer Institute's Smoking Quitline: 1-800-QUIT-NOW (850)217-9660) Text QUIT to SmokefreeTXT: 521151 Where to find more information Visit these websites to find more information on quitting smoking: U.S. Department of Health and Human Services: www.smokefree.gov American Lung Association: www.freedomfromsmoking.org Centers for Disease Control and Prevention (CDC): FootballExhibition.com.br American Heart Association: www.heart.org Contact a health care provider if: You  want to change your plan for quitting. The medicines you are taking are not helping. Your eating feels out of control or you cannot sleep. You feel depressed or become very anxious. Summary Quitting smoking is a physical and mental challenge. You will face cravings, withdrawal symptoms, and temptation to smoke again. Preparation can help you as you go through these challenges. Try different techniques to manage stress, handle social situations, and prevent weight gain. You can deal with cravings by keeping your mouth busy (such as by chewing gum), keeping your  hands and body busy, calling family or friends, or contacting a quitline for people who want to quit smoking. You can deal with withdrawal symptoms by avoiding places where people smoke, getting plenty of rest, and avoiding drinks that contain caffeine. This information is not intended to replace advice given to you by your health care provider. Make sure you discuss any questions you have with your health care provider. Document Revised: 03/16/2021 Document Reviewed: 03/16/2021 Elsevier Patient Education  2024 [employer]   .

## 2023-12-05 ENCOUNTER — Ambulatory Visit: Payer: Self-pay | Admitting: Family Medicine

## 2023-12-05 ENCOUNTER — Other Ambulatory Visit (HOSPITAL_BASED_OUTPATIENT_CLINIC_OR_DEPARTMENT_OTHER): Payer: Self-pay

## 2023-12-05 ENCOUNTER — Other Ambulatory Visit (INDEPENDENT_AMBULATORY_CARE_PROVIDER_SITE_OTHER)

## 2023-12-05 DIAGNOSIS — E782 Mixed hyperlipidemia: Secondary | ICD-10-CM | POA: Diagnosis not present

## 2023-12-05 DIAGNOSIS — E1165 Type 2 diabetes mellitus with hyperglycemia: Secondary | ICD-10-CM

## 2023-12-05 LAB — LIPID PANEL
Cholesterol: 157 mg/dL (ref 0–200)
HDL: 49.7 mg/dL (ref >39.00)
LDL Cholesterol: 71 mg/dL (ref 0–99)
NonHDL: 107.22
Total CHOL/HDL Ratio: 3
Triglycerides: 180 mg/dL — ABNORMAL HIGH (ref 0.0–149.0)
VLDL: 36 mg/dL (ref 0.0–40.0)

## 2023-12-05 LAB — MICROALBUMIN / CREATININE URINE RATIO
Creatinine,U: 95.5 mg/dL
Microalb Creat Ratio: 173.7 mg/g — ABNORMAL HIGH (ref 0.0–30.0)
Microalb, Ur: 16.6 mg/dL — ABNORMAL HIGH (ref 0.0–1.9)

## 2023-12-05 MED ORDER — FENOFIBRATE 145 MG PO TABS
145.0000 mg | ORAL_TABLET | Freq: Every day | ORAL | 1 refills | Status: AC
  Filled 2023-12-05: qty 90, 90d supply, fill #0

## 2023-12-12 NOTE — Addendum Note (Signed)
 Addended by: DORLENE CHIQUITA RAMAN on: 12/12/2023 07:20 AM   Modules accepted: Orders

## 2023-12-16 ENCOUNTER — Other Ambulatory Visit (HOSPITAL_BASED_OUTPATIENT_CLINIC_OR_DEPARTMENT_OTHER): Payer: Self-pay

## 2024-02-18 ENCOUNTER — Other Ambulatory Visit (HOSPITAL_BASED_OUTPATIENT_CLINIC_OR_DEPARTMENT_OTHER): Payer: Self-pay

## 2024-02-18 MED ORDER — FLUZONE HIGH-DOSE 0.5 ML IM SUSY
0.5000 mL | PREFILLED_SYRINGE | Freq: Once | INTRAMUSCULAR | 0 refills | Status: AC
  Filled 2024-02-18: qty 0.5, 1d supply, fill #0

## 2024-03-02 ENCOUNTER — Other Ambulatory Visit (HOSPITAL_BASED_OUTPATIENT_CLINIC_OR_DEPARTMENT_OTHER): Payer: Self-pay

## 2024-03-11 ENCOUNTER — Telehealth: Payer: Self-pay

## 2024-03-11 NOTE — Telephone Encounter (Signed)
 Copied from CRM #[id number]. Topic: General - Other >> Mar 11, 2024  3:00 PM Jasmin G wrote: Reason for CRM: Pt requested a call back at [phone] to discuss recent fall that he had at work.

## 2024-03-12 ENCOUNTER — Ambulatory Visit (HOSPITAL_BASED_OUTPATIENT_CLINIC_OR_DEPARTMENT_OTHER)
Admission: RE | Admit: 2024-03-12 | Discharge: 2024-03-12 | Disposition: A | Source: Ambulatory Visit | Attending: Student

## 2024-03-12 ENCOUNTER — Encounter: Payer: Self-pay | Admitting: Student

## 2024-03-12 ENCOUNTER — Other Ambulatory Visit (HOSPITAL_BASED_OUTPATIENT_CLINIC_OR_DEPARTMENT_OTHER): Payer: Self-pay

## 2024-03-12 ENCOUNTER — Ambulatory Visit: Admitting: Student

## 2024-03-12 VITALS — BP 158/72 | HR 60 | Temp 98.1°F | Ht 67.0 in | Wt 183.0 lb

## 2024-03-12 DIAGNOSIS — W19XXXA Unspecified fall, initial encounter: Secondary | ICD-10-CM

## 2024-03-12 DIAGNOSIS — I2510 Atherosclerotic heart disease of native coronary artery without angina pectoris: Secondary | ICD-10-CM

## 2024-03-12 DIAGNOSIS — M542 Cervicalgia: Secondary | ICD-10-CM | POA: Insufficient documentation

## 2024-03-12 DIAGNOSIS — R52 Pain, unspecified: Secondary | ICD-10-CM

## 2024-03-12 LAB — COMPREHENSIVE METABOLIC PANEL WITH GFR
AG Ratio: 1.8 (calc) (ref 1.0–2.5)
ALT: 30 U/L (ref 9–46)
AST: 26 U/L (ref 10–35)
Albumin: 4.4 g/dL (ref 3.6–5.1)
Alkaline phosphatase (APISO): 74 U/L (ref 35–144)
BUN: 12 mg/dL (ref 7–25)
CO2: 28 mmol/L (ref 20–32)
Calcium: 9.3 mg/dL (ref 8.6–10.3)
Chloride: 105 mmol/L (ref 98–110)
Creat: 0.89 mg/dL (ref 0.70–1.28)
Globulin: 2.5 g/dL (ref 1.9–3.7)
Glucose, Bld: 117 mg/dL — ABNORMAL HIGH (ref 65–99)
Potassium: 4.5 mmol/L (ref 3.5–5.3)
Sodium: 139 mmol/L (ref 135–146)
Total Bilirubin: 0.6 mg/dL (ref 0.2–1.2)
Total Protein: 6.9 g/dL (ref 6.1–8.1)
eGFR: 92 mL/min/1.73m2 (ref >=60)

## 2024-03-12 LAB — CBC
HCT: 44.8 % (ref 39.4–51.1)
Hemoglobin: 15.1 g/dL (ref 13.2–17.1)
MCH: 31.1 pg (ref 27.0–33.0)
MCHC: 33.7 g/dL (ref 31.6–35.4)
MCV: 92.2 fL (ref 81.4–101.7)
MPV: 10 fL (ref 7.5–12.5)
Platelets: 218 Thousand/uL (ref 140–400)
RBC: 4.86 Million/uL (ref 4.20–5.80)
RDW: 12.4 % (ref 11.0–15.0)
WBC: 5.1 Thousand/uL (ref 3.8–10.8)

## 2024-03-12 LAB — POCT I-STAT CREATININE: Creatinine, Ser: 1 mg/dL (ref 0.61–1.24)

## 2024-03-12 LAB — TSH: TSH: 1.02 m[IU]/L (ref 0.40–4.50)

## 2024-03-12 MED ORDER — TRAMADOL HCL 50 MG PO TABS
50.0000 mg | ORAL_TABLET | Freq: Three times a day (TID) | ORAL | 0 refills | Status: AC | PRN
  Filled 2024-03-12: qty 15, 5d supply, fill #0

## 2024-03-12 MED ORDER — IOHEXOL 300 MG/ML  SOLN
75.0000 mL | Freq: Once | INTRAMUSCULAR | Status: AC | PRN
  Administered 2024-03-12: 100 mL via INTRAVENOUS

## 2024-03-12 NOTE — Progress Notes (Addendum)
 Acute Office Visit  Subjective:     Patient ID: Peter Wong, male    DOB: 01-17-1951, 73 y.o.   MRN: [id number]  Chief Complaint  Patient presents with   Fall    Onset 03/10/24 - fell and hit his head, injured and bruised right arm and elbow Both head and arm is bruised  I'm feeling dizzy and light headed  Dizziness comes and goes Patient requested an MRI, since I hit my head    HPI Patient is in today for acute visit.  Peter Wong is a 73 year old male presents after a fall from a ladder while working on his ceiling. Wife resent during OV. Pt speaks Korean, radiation protection practitioner present.  Two days ago, Pt  reports he fell from a ladder and struck the top of his head, and right arm during the fall. Since then, he reports pain localized to head and neck pain, both rated 6-7/10 and worsened with movement or palpation. He denies that the fall was due to physical instability, uncertain exactly how it occurred. He describes feeling "a little out of it" immediately afterward but denies loss of consciousness. His wife was present after the incident. He did not seek medical care on the day of the injury. Since then he has intermittent lightheadedness. He denies vision changes, weakness, numbness, syncope. Denies history of seizures or Hx of syncope.  During the fall he bruised his elbow and underside of right arm, and describes generalized aches following the fall. Arm swelling has improved since initial fall. Since the incident, he has felt dizzy and dazed. 7/10 pain on top of head.He denies loss of consciousness, vomiting, headache, vision changes, slurred speech, numbness, tingling, weakness, chest pain, shortness of breath.    ROS See HPI     Objective:    BP (!) 158/72 (BP Location: Left Arm, Patient Position: Sitting, Cuff Size: Normal)   Pulse 60   Temp 98.1 F (36.7 C) (Oral)   Ht 5' 7 (1.702 m)   Wt 183 lb (83 kg)   SpO2 98%   BMI 28.66 kg/m    Physical Exam Vitals reviewed.   Constitutional:      General: He is not in acute distress.    Appearance: He is not toxic-appearing.  HENT:     Head: Normocephalic and atraumatic.     Mouth/Throat:     Mouth: Mucous membranes are moist.     Pharynx: Oropharynx is clear.  Eyes:     Extraocular Movements: Extraocular movements intact.     Pupils: Pupils are equal, round, and reactive to light.  Cardiovascular:     Rate and Rhythm: Normal rate and regular rhythm.     Pulses: Normal pulses.     Heart sounds: Normal heart sounds. No murmur heard. Pulmonary:     Effort: Pulmonary effort is normal. No respiratory distress.     Breath sounds: Normal breath sounds. No wheezing.  Musculoskeletal:        General: No swelling.     Cervical back: Neck supple.     Comments: Neck- FULL ROM, mild pain with full extension   Skin:    General: Skin is warm and dry.     Findings: Bruising present.     Comments: -Bruising and swelling understide of right arm -small, closed laceration anterior head, no drainage or signs of infection  Neurological:     General: No focal deficit present.     Mental Status: He is alert and oriented to person,  place, and time.     Comments: Mental status: Alert and oriented to person, place, time, and situation. Speech fluent and coherent. Memory, attention, and concentration intact. Cranial nerves II-XII: Grossly intact. Pupils equal, round, and reactive to light. EOM intact. No facial asymmetry. Normal hearing to whisper test. Palate elevates symmetrically. Tongue midline. Shoulder shrug and head turn intact. Motor: Normal bulk and tone. Strength 5/5 in bilateral upper and lower extremities. Reflexes: Deep tendon reflexes 2+ and symmetric patellar. Sensation: WNL  Gait: Steady with normal base. Negative Romberg.    Psychiatric:        Mood and Affect: Mood normal.        Behavior: Behavior normal.        Thought Content: Thought content normal.        Judgment: Judgment normal.     Results  for orders placed or performed during the hospital encounter of 03/12/24  I-STAT creatinine  Result Value Ref Range   Creatinine, Ser 1.00 0.61 - 1.24 mg/dL  Results for orders placed or performed in visit on 03/12/24  CBC  Result Value Ref Range   WBC 5.1 3.8 - 10.8 Thousand/uL   RBC 4.86 4.20 - 5.80 Million/uL   Hemoglobin 15.1 13.2 - 17.1 g/dL   HCT 55.1 60.5 - 48.8 %   MCV 92.2 81.4 - 101.7 fL   MCH 31.1 27.0 - 33.0 pg   MCHC 33.7 31.6 - 35.4 g/dL   RDW 87.5 88.9 - 84.9 %   Platelets 218 140 - 400 Thousand/uL   MPV 10.0 7.5 - 12.5 fL  Comp Met (CMET)  Result Value Ref Range   Glucose, Bld 117 (H) 65 - 99 mg/dL   BUN 12 7 - 25 mg/dL   Creat 9.10 9.29 - 8.71 mg/dL   eGFR 92 > OR = 60 fO/fpw/8.26f7   BUN/Creatinine Ratio SEE NOTE: 6 - 22 (calc)   Sodium 139 135 - 146 mmol/L   Potassium 4.5 3.5 - 5.3 mmol/L   Chloride 105 98 - 110 mmol/L   CO2 28 20 - 32 mmol/L   Calcium  9.3 8.6 - 10.3 mg/dL   Total Protein 6.9 6.1 - 8.1 g/dL   Albumin 4.4 3.6 - 5.1 g/dL   Globulin 2.5 1.9 - 3.7 g/dL (calc)   AG Ratio 1.8 1.0 - 2.5 (calc)   Total Bilirubin 0.6 0.2 - 1.2 mg/dL   Alkaline phosphatase (APISO) 74 35 - 144 U/L   AST 26 10 - 35 U/L   ALT 30 9 - 46 U/L  TSH  Result Value Ref Range   TSH 1.02 0.40 - 4.50 mIU/L        Assessment & Plan:   Problem List Items Addressed This Visit     RESOLVED: Coronary artery disease involving native coronary artery of native heart without angina pectoris   Fall - Primary   Plan as above Seek emergency care immediately if you develop worsening headache, vomiting, confusion, weakness, syncope, vision changes, or trouble walking. Go to the ER for any new neurological symptoms or if you become increasingly drowsy or difficult to wake. Return urgently if symptoms worsen.       Relevant Orders   CT HEAD WO CONTRAST ( ) (Completed)   CT CERVICAL SPINE W WO CONTRAST (Completed)   CBC (Completed)   Comp Met (CMET) (Completed)   TSH  (Completed)   Neck pain   Rx- Tramadol  Recommend conservative care with rest, activity modification May alternate ice/ heat, and gentle  stretching as tolerated.  OTC analgesics- Tylenol , NSAIDs prn       Other Visit Diagnoses       Pain       Relevant Medications   traMADol  (ULTRAM ) 50 MG tablet          Meds ordered this encounter  Medications   traMADol  (ULTRAM ) 50 MG tablet    Sig: Take 1 tablet (50 mg total) by mouth every 8 (eight) hours as needed for up to 5 days for severe pain (pain score 7-10) or moderate pain (pain score 4-6).    Dispense:  15 tablet    Refill:  0    Supervising Provider:   DOMENICA BLACKBIRD A [[id number]]    No follow-ups on file.  Nikea Settle L Geral Tuch, NP

## 2024-03-12 NOTE — Assessment & Plan Note (Addendum)
 Plan as above Seek emergency care immediately if you develop worsening headache, vomiting, confusion, weakness, syncope, vision changes, or trouble walking. Go to the ER for any new neurological symptoms or if you become increasingly drowsy or difficult to wake. Return urgently if symptoms worsen.

## 2024-03-12 NOTE — Telephone Encounter (Signed)
 Pt has appointment with Peter Wong.

## 2024-03-13 ENCOUNTER — Ambulatory Visit: Payer: Self-pay | Admitting: Student

## 2024-03-13 NOTE — Assessment & Plan Note (Signed)
 Rx- Tramadol  Recommend conservative care with rest, activity modification May alternate ice/ heat, and gentle stretching as tolerated.  OTC analgesics- Tylenol , NSAIDs prn

## 2024-03-19 ENCOUNTER — Ambulatory Visit: Payer: Self-pay

## 2024-03-19 NOTE — Telephone Encounter (Signed)
 FYI Only or Action Required?: FYI only for provider: Going to [facility] for xray.  Patient was last seen in primary care on 03/12/2024 by Wheeler Harlene CROME, NP.  Called Nurse Triage reporting Arm Injury.  Symptoms began a week ago.  Interventions attempted: Rest, hydration, or home remedies.  Symptoms are: stable.  Triage Disposition: See Physician Within 24 Hours  Patient/caregiver understands and will follow disposition?: Yes Reason for Disposition  [1] MODERATE pain (e.g., interferes with normal activities) AND [2] high-risk adult (e.g., age > 60 years, osteoporosis, chronic steroid use)  Answer Assessment - Initial Assessment Questions Patient fell last week, was seen in PCP office 03/12/24. Patient's wife calling in today, she is adamant on getting an Xray, advised patient to UC for xray as there is no availability in the office until next week. Wanted to schedule next week with PCP and will take patient to UC today for xray of arm  1. ONSET: When did the injury happen? (e.g., minutes, hours ago)      Last week  2. LOCATION: Where is the injury located? Which arm?     Right arm  3. APPEARANCE of INJURY: What does the injury look like?      A lot of bruising, swelling  4. SEVERITY: Can you use the arm normally?      Yes  5. PAIN: Is there pain? If Yes, ask: How bad is the pain? (Scale 0-10; or none, mild, moderate, severe)     8/10  6. OTHER SYMPTOMS: Do you have any other symptoms?  (e.g., numbness in hand)     Denies  Protocols used: Arm Injury-A-AH  Copied from CRM #[id number]. Topic: Clinical - Red Word Triage >> Mar 19, 2024  9:25 AM Tiffini S wrote: [facility] that prompted transfer to Nurse Triage: The patient spouse Hyon said last week patient fell on a ladder- hit his head and right arm/ a lot of bruising with discoloration for black and purple weak,and having pain

## 2024-03-23 ENCOUNTER — Other Ambulatory Visit (HOSPITAL_BASED_OUTPATIENT_CLINIC_OR_DEPARTMENT_OTHER): Payer: Self-pay

## 2024-03-23 ENCOUNTER — Encounter: Payer: Self-pay | Admitting: Family Medicine

## 2024-03-23 ENCOUNTER — Ambulatory Visit: Admitting: Family Medicine

## 2024-03-23 VITALS — BP 122/74 | HR 67 | Temp 98.0°F | Resp 16 | Ht 67.0 in | Wt 181.2 lb

## 2024-03-23 DIAGNOSIS — M7021 Olecranon bursitis, right elbow: Secondary | ICD-10-CM

## 2024-03-23 MED ORDER — METHYLPREDNISOLONE ACETATE 40 MG/ML IJ SUSP
40.0000 mg | Freq: Once | INTRAMUSCULAR | Status: AC
  Administered 2024-03-23: 15:00:00 40 mg via INTRAMUSCULAR

## 2024-03-23 NOTE — Progress Notes (Signed)
 Musculoskeletal Exam  Patient: Peter Wong DOB: 04/11/1950  DOS: 03/23/2024  SUBJECTIVE:  Chief Complaint:   Chief Complaint  Patient presents with   Fall    Fall    Peter Wong is a 73 y.o.  male for evaluation and treatment of R elbow pain. Here w wife and aid of Korean video interpreter.   Onset:  2 weeks ago. No inj or change in activity.  Location: R elbow Character:  aching  Progression of issue:  is unchanged Associated symptoms: Swelling, feels warm No fevers, redness, drainage Treatment: to date has been ice.   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 No acute distress. Thorax & Lungs: No accessory muscle use Musculoskeletal: R elbow.   Normal active range of motion: yes.   Normal passive range of motion: yes Tenderness to palpation: yes over the olecranon Deformity: swelling Ecchymosis: not over elbow Neurologic: Normal sensory function.  Psychiatric: Normal mood. Age appropriate judgment and insight. Alert & oriented x 3.    Procedure note: Greater trochanteric bursa injection Verbal consent obtained. The area of interest was palpated and cleaned with an alcohol swab. Freeze spray was used. A 23 g needle was inserted at a parallel angle through the area of interested. The plunger was withdrawn and 5 mL of serosanguineous fluid was aspirated. 1 mL of 1% lidocaine  without epi and 20 mg of Depo-Medrol  was injected. A bandaid was placed. The patient tolerated the procedure  well.  There were no complications noted.   Assessment:  Olecranon bursitis of right elbow - Plan: PR ARTHROCENTESIS ASPIR&/INJ INTERM JT/BURS W/O US   Plan: Compression (Ace bandage provided today), heat, ice, Tylenol .  F/u as originally scheduled. The patient and his wife through the interpreter voiced understanding and agreement to the plan.   [name] [place], DO 03/23/2024  2:33 PM

## 2024-03-23 NOTE — Addendum Note (Signed)
 Addended by: Keaundra Stehle M on: 03/23/2024 02:43 PM   Modules accepted: Orders

## 2024-03-23 NOTE — Patient Instructions (Signed)
 OK to take Tylenol  1000 mg (2 extra strength tabs) or 975 mg (3 regular strength tabs) every 6 hours as needed.  Ice/cold pack over area for 10-15 min twice daily.  Wrap the area while awake.   Let us  know if you need anything.

## 2024-03-26 ENCOUNTER — Ambulatory Visit: Admitting: Family Medicine

## 2024-04-16 ENCOUNTER — Other Ambulatory Visit: Payer: Self-pay

## 2024-04-16 DIAGNOSIS — E782 Mixed hyperlipidemia: Secondary | ICD-10-CM

## 2024-04-16 DIAGNOSIS — E1165 Type 2 diabetes mellitus with hyperglycemia: Secondary | ICD-10-CM

## 2024-04-16 MED ORDER — ATORVASTATIN CALCIUM 80 MG PO TABS: 80.0000 mg | ORAL_TABLET | Freq: Every day | ORAL | 1 refills | Status: AC

## 2024-04-16 MED ORDER — METOPROLOL SUCCINATE ER 25 MG PO TB24: 25.0000 mg | ORAL_TABLET | Freq: Every day | ORAL | 1 refills | Status: AC

## 2024-04-19 ENCOUNTER — Telehealth: Payer: Self-pay | Admitting: Family Medicine

## 2024-04-19 NOTE — Telephone Encounter (Signed)
 Copied from CRM #[id number]. Topic: Medicare AWV >> Apr 19, 2024  9:12 AM Nathanel DEL wrote: Called LVM 04/19/2024 to sched AWVI. Please schedule AWVI in office.   Nathanel Paschal; Care Guide Ambulatory Clinical Support [place] [facility] Direct Dial: [phone]

## 2024-04-28 ENCOUNTER — Ambulatory Visit

## 2024-04-28 VITALS — BP 138/82 | HR 65 | Temp 97.8°F | Ht 67.0 in | Wt 180.6 lb

## 2024-04-28 DIAGNOSIS — Z0000 Encounter for general adult medical examination without abnormal findings: Secondary | ICD-10-CM

## 2024-04-28 NOTE — Patient Instructions (Signed)
 Mr. Pangilinan,  Thank you for taking the time for your Medicare Wellness Visit. I appreciate your continued commitment to your health goals. Please review the care plan we discussed, and feel free to reach out if I can assist you further.  Please note that Annual Wellness Visits do not include a physical exam. Some assessments may be limited, especially if the visit was conducted virtually. If needed, we may recommend an in-person follow-up with your provider.  Ongoing Care Seeing your primary care provider every 3 to 6 months helps us  monitor your health and provide consistent, personalized care.   Referrals If a referral was made during today's visit and you haven't received any updates within two weeks, please contact the referred provider directly to check on the status.  Recommended Screenings:  Health Maintenance  Topic Date Due   Medicare Annual Wellness Visit  Never done   COVID-19 Vaccine (7 - 2025-26 season) 12/08/2023   Complete foot exam   04/29/2024   Hemoglobin A1C  05/06/2024   Kidney health urinalysis for diabetes  06/05/2024   Screening for Lung Cancer  06/24/2024   Eye exam for diabetics  10/27/2024   Yearly kidney function blood test for diabetes  03/12/2025   Colon Cancer Screening  06/05/2027   DTaP/Tdap/Td vaccine (3 - Td or Tdap) 04/29/2033   Pneumococcal Vaccine for age over 27  Completed   Flu Shot  Completed   Hepatitis C Screening  Completed   Zoster (Shingles) Vaccine  Completed   Meningitis B Vaccine  Aged Out       02/27/2021    9:53 AM  Advanced Directives  Does Patient Have a Medical Advance Directive? No  Would patient like information on creating a medical advance directive? No - Patient declined    Vision: Annual vision screenings are recommended for early detection of glaucoma, cataracts, and diabetic retinopathy. These exams can also reveal signs of chronic conditions such as diabetes and high blood pressure.  Dental: Annual dental screenings  help detect early signs of oral cancer, gum disease, and other conditions linked to overall health, including heart disease and diabetes.  Please see the attached documents for additional preventive care recommendations.

## 2024-04-28 NOTE — Progress Notes (Signed)
 " I connected with  Peter Wong on 04/28/24 in office visit   Patient Location: Other:  in office   Provider Location: Office/Clinic  Persons Participating in Visit: Patient.  Chief Complaint  Patient presents with   Medicare Wellness     Subjective:   Peter Wong is a 74 y.o. male who presents for a Medicare Annual Wellness Visit.  Visit info / Clinical Intake: Medicare Wellness Visit Type:: Initial Annual Wellness Visit Persons participating in visit and providing information:: patient Medicare Wellness Visit Mode:: In-person (required for WTM) Interpreter Needed?: No Pre-visit prep was completed: no AWV questionnaire completed by patient prior to visit?: no Living arrangements:: lives with spouse/significant other Patient's Overall Health Status Rating: very good Typical amount of pain: some Does pain affect daily life?: no Are you currently prescribed opioids?: no  Dietary Habits and Nutritional Risks How many meals a day?: 3 Eats fruit and vegetables daily?: yes Most meals are obtained by: having others provide food In the last 2 weeks, have you had any of the following?: none Diabetic:: no  Functional Status Activities of Daily Living (to include ambulation/medication): Independent Ambulation: Independent Medication Administration: Independent Home Management (perform basic housework or laundry): Independent Manage your own finances?: yes Primary transportation is: driving Concerns about vision?: no *vision screening is required for WTM* Concerns about hearing?: no  Fall Screening Falls in the past year?: 1 Number of falls in past year: 0 Was there an injury with Fall?: 1 Fall Risk Category Calculator: 2 Patient Fall Risk Level: Moderate Fall Risk  Fall Risk Patient at Risk for Falls Due to: History of fall(s); Impaired balance/gait Fall risk Follow up: Falls evaluation completed; Education provided; Falls prevention discussed  Home and Transportation Safety: All  rugs have non-skid backing?: N/A, no rugs All stairs or steps have railings?: N/A, no stairs Grab bars in the bathtub or shower?: yes Have non-skid surface in bathtub or shower?: yes Good home lighting?: yes Regular seat belt use?: yes Hospital stays in the last year:: no  Cognitive Assessment Difficulty concentrating, remembering, or making decisions? : no Will 6CIT or Mini Cog be Completed: yes What year is it?: 0 points What month is it?: 0 points Give patient an address phrase to remember (5 components): apple , table, car remember words About what time is it?: 0 points Count backwards from 20 to 1: 0 points Say the months of the year in reverse: 0 points Repeat the address phrase from earlier: 0 points 6 CIT Score: 0 points  Advance Directives (For Healthcare) Does Patient Have a Medical Advance Directive?: No Would patient like information on creating a medical advance directive?: No - Patient declined  Reviewed/Updated  Reviewed/Updated: Reviewed All (Medical, Surgical, Family, Medications, Allergies, Care Teams, Patient Goals)    Allergies (verified) Patient has no known allergies.   Current Medications (verified) Outpatient Encounter Medications as of 04/28/2024  Medication Sig   aspirin 81 MG chewable tablet Chew 1 tablet by mouth daily.   atorvastatin  (LIPITOR) 80 MG tablet Take 1 tablet (80 mg total) by mouth daily.   fenofibrate  (TRICOR ) 145 MG tablet Take 1 tablet (145 mg total) by mouth daily.   methylPREDNISolone  (MEDROL  DOSEPAK) 4 MG TBPK tablet Follow instructions on package; Use as directed.   metoprolol  succinate (TOPROL -XL) 25 MG 24 hr tablet Take 1 tablet (25 mg total) by mouth daily.   pantoprazole (PROTONIX) 40 MG tablet Take 40 mg by mouth daily.   No facility-administered encounter medications on file as of  04/28/2024.    History: Past Medical History:  Diagnosis Date   Coronary artery disease involving autologous artery coronary bypass graft  with angina pectoris    Diabetes mellitus without complication (HCC)    on meds   Hyperlipidemia    on meds   Hypertension    on meds   Past Surgical History:  Procedure Laterality Date   BIOPSY  02/27/2021   Procedure: BIOPSY;  Surgeon: Charlanne Groom, MD;  Location: [facility];  Service: Endoscopy;;   COLONOSCOPY  06/04/2017    [facility]. Normal mucous in the whole colon.Internal hemorrhoids. Colonoscopy was otherwise normal   CORONARY ARTERY BYPASS GRAFT  10/2014   ESOPHAGOGASTRODUODENOSCOPY (EGD) WITH PROPOFOL  N/A 02/27/2021   Procedure: ESOPHAGOGASTRODUODENOSCOPY (EGD) WITH PROPOFOL ;  Surgeon: Charlanne Groom, MD;  Location: [facility];  Service: Endoscopy;  Laterality: N/A;   Family History  Problem Relation Age of Onset   Lung cancer Mother    Colon cancer Neg Hx    Esophageal cancer Neg Hx    Colon polyps Neg Hx    Rectal cancer Neg Hx    Stomach cancer Neg Hx    Social History   Occupational History   Not on file  Tobacco Use   Smoking status: Former    Current packs/day: 0.00    Average packs/day: 1 pack/day for 40.0 years (40.0 ttl pk-yrs)    Types: Cigarettes    Start date: 04/29/1976    Quit date: 04/29/2016    Years since quitting: 8.0   Smokeless tobacco: Never   Tobacco comments:    Pt hasn't smoked since end of year  Vaping Use   Vaping status: Never Used  Substance and Sexual Activity   Alcohol use: Not Currently   Drug use: Never   Sexual activity: Not Currently   Tobacco Counseling Counseling given: Not Answered Tobacco comments: Pt hasn't smoked since end of year  SDOH Screenings   Food Insecurity: No Food Insecurity (04/28/2024)  Housing: Low Risk (04/28/2024)  Transportation Needs: No Transportation Needs (04/28/2024)  Utilities: Not At Risk (04/28/2024)  Depression (PHQ2-9): Low Risk (04/28/2024)  Physical Activity: Sufficiently Active (04/28/2024)  Social Connections: Socially Integrated (04/28/2024)  Stress: No Stress  Concern Present (04/28/2024)  Tobacco Use: Medium Risk (04/28/2024)  Health Literacy: Adequate Health Literacy (04/28/2024)   See flowsheets for full screening details  Depression Screen PHQ 2 & 9 Depression Scale- Over the past 2 weeks, how often have you been bothered by any of the following problems? Little interest or pleasure in doing things: 0 Feeling down, depressed, or hopeless (PHQ Adolescent also includes...irritable): 0 PHQ-2 Total Score: 0 Trouble falling or staying asleep, or sleeping too much: 0 Feeling tired or having little energy: 0 Poor appetite or overeating (PHQ Adolescent also includes...weight loss): 0 Feeling bad about yourself - or that you are a failure or have let yourself or your family down: 0 Trouble concentrating on things, such as reading the newspaper or watching television (PHQ Adolescent also includes...like school work): 0 Moving or speaking so slowly that other people could have noticed. Or the opposite - being so fidgety or restless that you have been moving around a lot more than usual: 0 Thoughts that you would be better off dead, or of hurting yourself in some way: 0 PHQ-9 Total Score: 0 If you checked off any problems, how difficult have these problems made it for you to do your work, take care of things at home, or get along with other  people?: Not difficult at all     Goals Addressed             This Visit's Progress    Patient Stated       Patient will like to go on another cruise              Objective:    Today's Vitals   04/28/24 0942  BP: 138/82  Pulse: 65  Temp: 97.8 F (36.6 C)  Weight: 180 lb 9.6 oz (81.9 kg)  Height: 5' 7 (1.702 m)   Body mass index is 28.29 kg/m.  Hearing/Vision screen Hearing Screening - Comments:: No difficulties hearing  Vision Screening - Comments:: Patient has had eye surgery  Immunizations and Health Maintenance Health Maintenance  Topic Date Due   COVID-19 Vaccine (7 - 2025-26 season)  12/08/2023   FOOT EXAM  04/29/2024   HEMOGLOBIN A1C  05/06/2024   Diabetic kidney evaluation - Urine ACR  06/05/2024   Lung Cancer Screening  06/24/2024   OPHTHALMOLOGY EXAM  10/27/2024   Diabetic kidney evaluation - eGFR measurement  03/12/2025   Medicare Annual Wellness (AWV)  04/28/2025   Colonoscopy  06/05/2027   DTaP/Tdap/Td (3 - Td or Tdap) 04/29/2033   Pneumococcal Vaccine: 50+ Years  Completed   Influenza Vaccine  Completed   Hepatitis C Screening  Completed   Zoster Vaccines- Shingrix   Completed   Meningococcal B Vaccine  Aged Out        Assessment/Plan:  This is a routine wellness examination for Peter Wong.  Patient Care Team: Frann [name], DO as PCP - General (Family Medicine) Monetta Redell PARAS, MD as PCP - Cardiology (Cardiology)  I have personally reviewed and noted the following in the patients chart:   Medical and social history Use of alcohol, tobacco or illicit drugs  Current medications and supplements including opioid prescriptions. Functional ability and status Nutritional status Physical activity Advanced directives List of other physicians Hospitalizations, surgeries, and ER visits in previous 12 months Vitals Screenings to include cognitive, depression, and falls Referrals and appointments  No orders of the defined types were placed in this encounter.  In addition, I have reviewed and discussed with patient certain preventive protocols, quality metrics, and best practice recommendations. A written personalized care plan for preventive services as well as general preventive health recommendations were provided to patient.   Peter Wong, [place]   04/28/2024   No follow-ups on file.  After Visit Summary: (In Person-Declined) Patient declined AVS at this time.  Nurse Notes: No concerns no covid vaccine  "

## 2024-05-17 ENCOUNTER — Encounter: Admitting: Family Medicine
